# Patient Record
Sex: Female | Born: 1975 | Race: Black or African American | Hispanic: No | Marital: Single | Smoking: Current every day smoker
Health system: Southern US, Community
[De-identification: ages and names within clinical notes are randomized; demographics above are authoritative.]

## PROBLEM LIST (undated history)

## (undated) DIAGNOSIS — R87619 Unspecified abnormal cytological findings in specimens from cervix uteri: Secondary | ICD-10-CM

## (undated) DIAGNOSIS — D649 Anemia, unspecified: Secondary | ICD-10-CM

## (undated) HISTORY — DX: Unspecified abnormal cytological findings in specimens from cervix uteri: R87.619

## (undated) HISTORY — PX: MYOMECTOMY: SHX85

## (undated) HISTORY — DX: Anemia, unspecified: D64.9

---

## 2000-01-29 ENCOUNTER — Emergency Department (HOSPITAL_COMMUNITY): Admission: EM | Admit: 2000-01-29 | Discharge: 2000-01-29 | Payer: Self-pay

## 2001-08-25 ENCOUNTER — Other Ambulatory Visit: Admission: RE | Admit: 2001-08-25 | Discharge: 2001-08-25 | Payer: Self-pay | Admitting: Obstetrics and Gynecology

## 2002-06-16 ENCOUNTER — Other Ambulatory Visit: Admission: RE | Admit: 2002-06-16 | Discharge: 2002-06-16 | Payer: Self-pay | Admitting: Obstetrics and Gynecology

## 2002-09-16 ENCOUNTER — Other Ambulatory Visit: Admission: RE | Admit: 2002-09-16 | Discharge: 2002-09-16 | Payer: Self-pay | Admitting: Obstetrics and Gynecology

## 2003-12-29 ENCOUNTER — Other Ambulatory Visit: Admission: RE | Admit: 2003-12-29 | Discharge: 2003-12-29 | Payer: Self-pay | Admitting: Obstetrics and Gynecology

## 2014-08-31 ENCOUNTER — Encounter: Payer: Self-pay | Admitting: Certified Nurse Midwife

## 2014-09-05 ENCOUNTER — Ambulatory Visit (INDEPENDENT_AMBULATORY_CARE_PROVIDER_SITE_OTHER): Payer: BC Managed Care – PPO | Admitting: Certified Nurse Midwife

## 2014-09-05 ENCOUNTER — Encounter: Payer: Self-pay | Admitting: Certified Nurse Midwife

## 2014-09-05 VITALS — BP 100/64 | HR 70 | Resp 16 | Ht 62.75 in | Wt 171.0 lb

## 2014-09-05 DIAGNOSIS — D649 Anemia, unspecified: Secondary | ICD-10-CM

## 2014-09-05 DIAGNOSIS — N898 Other specified noninflammatory disorders of vagina: Secondary | ICD-10-CM

## 2014-09-05 DIAGNOSIS — Z124 Encounter for screening for malignant neoplasm of cervix: Secondary | ICD-10-CM

## 2014-09-05 DIAGNOSIS — N852 Hypertrophy of uterus: Secondary | ICD-10-CM

## 2014-09-05 DIAGNOSIS — Z01419 Encounter for gynecological examination (general) (routine) without abnormal findings: Secondary | ICD-10-CM

## 2014-09-05 DIAGNOSIS — Z0000 Encounter for general adult medical examination without abnormal findings: Secondary | ICD-10-CM

## 2014-09-05 DIAGNOSIS — D509 Iron deficiency anemia, unspecified: Secondary | ICD-10-CM

## 2014-09-05 LAB — IBC PANEL
%SAT: 3 % — ABNORMAL LOW (ref 20–55)
TIBC: 388 ug/dL (ref 250–470)
UIBC: 378 ug/dL (ref 125–400)

## 2014-09-05 LAB — CBC
HCT: 33.9 % — ABNORMAL LOW (ref 36.0–46.0)
Hemoglobin: 10.3 g/dL — ABNORMAL LOW (ref 12.0–15.0)
MCH: 24.6 pg — AB (ref 26.0–34.0)
MCHC: 30.4 g/dL (ref 30.0–36.0)
MCV: 80.9 fL (ref 78.0–100.0)
PLATELETS: 340 10*3/uL (ref 150–400)
RBC: 4.19 MIL/uL (ref 3.87–5.11)
RDW: 16 % — ABNORMAL HIGH (ref 11.5–15.5)
WBC: 5.5 10*3/uL (ref 4.0–10.5)

## 2014-09-05 LAB — POCT URINALYSIS DIPSTICK
BILIRUBIN UA: NEGATIVE
Blood, UA: NEGATIVE
Glucose, UA: NEGATIVE
Ketones, UA: NEGATIVE
LEUKOCYTES UA: NEGATIVE
NITRITE UA: NEGATIVE
PH UA: 5
Protein, UA: NEGATIVE
Urobilinogen, UA: NEGATIVE

## 2014-09-05 LAB — LIPID PANEL
CHOLESTEROL: 141 mg/dL (ref 0–200)
HDL: 68 mg/dL (ref >39)
LDL Cholesterol: 65 mg/dL (ref 0–99)
TRIGLYCERIDES: 42 mg/dL (ref <150)
Total CHOL/HDL Ratio: 2.1 Ratio
VLDL: 8 mg/dL (ref 0–40)

## 2014-09-05 LAB — FERRITIN: Ferritin: 1 ng/mL — ABNORMAL LOW (ref 10–291)

## 2014-09-05 LAB — TSH: TSH: 2.1 u[IU]/mL (ref 0.350–4.500)

## 2014-09-05 LAB — IRON: Iron: 10 ug/dL — ABNORMAL LOW (ref 42–145)

## 2014-09-05 NOTE — Patient Instructions (Signed)
Fibroids Fibroids are lumps (tumors) that can occur any place in a woman's body. These lumps are not cancerous. Fibroids vary in size, weight, and where they grow. HOME CARE  Do not take aspirin.  Write down the number of pads or tampons you use during your period. Tell your doctor. This can help determine the best treatment for you. GET HELP RIGHT AWAY IF:  You have pain in your lower belly (abdomen) that is not helped with medicine.  You have cramps that are not helped with medicine.  You have more bleeding between or during your period.  You feel lightheaded or pass out (faint).  Your lower belly pain gets worse. MAKE SURE YOU:  Understand these instructions.  Will watch your condition.  Will get help right away if you are not doing well or get worse. Document Released: 01/04/2011 Document Revised: 02/24/2012 Document Reviewed: 01/04/2011 [facility] Patient Information 2015 [place], [place]. This information is not intended to replace advice given to you by your health care provider. Make sure you discuss any questions you have with your health care provider. Iron-Rich Diet An iron-rich diet contains foods that are good sources of iron. Iron is an important mineral that helps your body produce hemoglobin. Hemoglobin is a protein in red blood cells that carries oxygen to the body's tissues. Sometimes, the iron level in your blood can be low. This may be caused by:  A lack of iron in your diet.  Blood loss.  Times of growth, such as during pregnancy or during a child's growth and development. Low levels of iron can cause a decrease in the number of red blood cells. This can result in iron deficiency anemia. Iron deficiency anemia symptoms include:  Tiredness.  Weakness.  Irritability.  Increased chance of infection. Here are some recommendations for daily iron intake:  Males older than 36 years of age need 8 mg of iron per day.  Women ages 35 to 58 need 18 mg of iron per  day.  Pregnant women need 27 mg of iron per day, and women who are over 95 years of age and breastfeeding need 9 mg of iron per day.  Women over the age of 55 need 8 mg of iron per day. SOURCES OF IRON There are 2 types of iron that are found in food: heme iron and nonheme iron. Heme iron is absorbed by the body better than nonheme iron. Heme iron is found in meat, poultry, and fish. Nonheme iron is found in grains, beans, and vegetables. Heme Iron Sources Food / Iron (mg)  Chicken liver, 3 oz (85 g)/ 10 mg  Beef liver, 3 oz (85 g)/ 5.5 mg  Oysters, 3 oz (85 g)/ 8 mg  Beef, 3 oz (85 g)/ 2 to 3 mg  Shrimp, 3 oz (85 g)/ 2.8 mg  [place], 3 oz (85 g)/ 2 mg  Chicken, 3 oz (85 g) / 1 mg  Fish (tuna, halibut), 3 oz (85 g)/ 1 mg  Pork, 3 oz (85 g)/ 0.9 mg Nonheme Iron Sources Food / Iron (mg)  Ready-to-eat breakfast cereal, iron-fortified / 3.9 to 7 mg  Tofu,  cup / 3.4 mg  Kidney beans,  cup / 2.6 mg  Baked potato with skin / 2.7 mg  Asparagus,  cup / 2.2 mg  Avocado / 2 mg  Dried peaches,  cup / 1.6 mg  Raisins,  cup / 1.5 mg  Soy milk, 1 cup / 1.5 mg  Whole-wheat bread, 1 slice / 1.2  mg  Spinach, 1 cup / 0.8 mg  Broccoli,  cup / 0.6 mg IRON ABSORPTION Certain foods can decrease the body's absorption of iron. Try to avoid these foods and beverages while eating meals with iron-containing foods:  Coffee.  Tea.  Fiber.  Soy. Foods containing vitamin C can help increase the amount of iron your body absorbs from iron sources, especially from nonheme sources. Eat foods with vitamin C along with iron-containing foods to increase your iron absorption. Foods that are high in vitamin C include many fruits and vegetables. Some good sources are:  Fresh orange juice.  Oranges.  Strawberries.  Mangoes.  Grapefruit.  Red bell peppers.  Green bell peppers.  Broccoli.  Potatoes with skin.  Tomato juice. Document Released: 07/16/2005 Document Revised:  02/24/2012 Document Reviewed: 05/23/2011 [facility] Patient Information 2015 [place], [place]. This information is not intended to replace advice given to you by your health care provider. Make sure you discuss any questions you have with your health care provider.   EXERCISE AND DIET:  We recommended that you start or continue a regular exercise program for good health. Regular exercise means any activity that makes your heart beat faster and makes you sweat.  We recommend exercising at least 30 minutes per day at least 3 days a week, preferably 4 or 5.  We also recommend a diet low in fat and sugar.  Inactivity, poor dietary choices and obesity can cause diabetes, heart attack, stroke, and kidney damage, among others.    ALCOHOL AND SMOKING:  Women should limit their alcohol intake to no more than 7 drinks/beers/glasses of wine (combined, not each!) per week. Moderation of alcohol intake to this level decreases your risk of breast cancer and liver damage. And of course, no recreational drugs are part of a healthy lifestyle.  And absolutely no smoking or even second hand smoke. Most people know smoking can cause heart and lung diseases, but did you know it also contributes to weakening of your bones? Aging of your skin?  Yellowing of your teeth and nails?  CALCIUM AND VITAMIN D:  Adequate intake of calcium and Vitamin D are recommended.  The recommendations for exact amounts of these supplements seem to change often, but generally speaking 600 mg of calcium (either carbonate or citrate) and 800 units of Vitamin D per day seems prudent. Certain women may benefit from higher intake of Vitamin D.  If you are among these women, your doctor will have told you during your visit.    PAP SMEARS:  Pap smears, to check for cervical cancer or precancers,  have traditionally been done yearly, although recent scientific advances have shown that most women can have pap smears less often.  However, every woman still should  have a physical exam from her gynecologist every year. It will include a breast check, inspection of the vulva and vagina to check for abnormal growths or skin changes, a visual exam of the cervix, and then an exam to evaluate the size and shape of the uterus and ovaries.  And after 36 years of age, a rectal exam is indicated to check for rectal cancers. We will also provide age appropriate advice regarding health maintenance, like when you should have certain vaccines, screening for sexually transmitted diseases, bone density testing, colonoscopy, mammograms, etc.   MAMMOGRAMS:  All women over 26 years old should have a yearly mammogram. Many facilities now offer a "3D" mammogram, which may cost around $50 extra out of pocket. If possible,  we recommend you accept the option to have the 3D mammogram performed.  It both reduces the number of women who will be called back for extra views which then turn out to be normal, and it is better than the routine mammogram at detecting truly abnormal areas.    COLONOSCOPY:  Colonoscopy to screen for colon cancer is recommended for all women at age 74.  We know, you hate the idea of the prep.  We agree, BUT, having colon cancer and not knowing it is worse!!  Colon cancer so often starts as a polyp that can be seen and removed at colonscopy, which can quite literally save your life!  And if your first colonoscopy is normal and you have no family history of colon cancer, most women don't have to have it again for 10 years.  Once every ten years, you can do something that may end up saving your life, right?  We will be happy to help you get it scheduled when you are ready.  Be sure to check your insurance coverage so you understand how much it will cost.  It may be covered as a preventative service at no cost, but you should check your particular policy.

## 2014-09-05 NOTE — Progress Notes (Signed)
38 y.o. G0P0000 Single African American Fe here for annual exam. Sexually active in past but no sexual activity in past year. Desires STD screening today, no concerns. Periods normal every 28 -36 days with minimal cramping. Patient complaining of spotting in 6/15 and this month 9/17 2-3 days after period. Contraception not needed at present, when active uses condoms. Denies vaginal itching or burning or change in discharge.  Sees Urgent care if needed. Last TDAP in college greater than 10 years ago. No history of anemia.  Patient's last menstrual period was 08/25/2014.          Sexually active: Yes.    The current method of family planning is none.    Exercising: Yes.    cardio & weights Smoker:  yes  Health Maintenance: Pap:  21yrs ago? MMG:  none Colonoscopy:  none BMD:   none TDaP:  Unsure per patient Labs: Hgb-10.3, Poct urine-neg Self breast exam: done occ   reports that she has been smoking.  She has never used smokeless tobacco. She reports that she drinks about 1.5 - 2 ounces of alcohol per week. She reports that she does not use illicit drugs.  Past Medical History  Diagnosis Date  . Abnormal Pap smear of cervix     yrs ago, pt believes she had a procedure done for this    History reviewed. No pertinent past surgical history.  No current outpatient prescriptions on file.   No current facility-administered medications for this visit.    Family History  Problem Relation Age of Onset  . Diabetes Mother   . Hypertension Mother   . Cancer Maternal Grandfather     prostate  . Cancer Paternal Grandmother     lung    ROS:  Pertinent items are noted in HPI.  Otherwise, a comprehensive ROS was negative.  Exam:   BP 100/64  Pulse 70  Resp 16  Ht 5' 2.75" (1.594 m)  Wt 171 lb (77.565 kg)  BMI 30.53 kg/m2  LMP 08/25/2014 Height: 5' 2.75" (159.4 cm)  Ht Readings from Last 3 Encounters:  09/05/14 5' 2.75" (1.594 m)    General appearance: alert, cooperative and appears  stated age Head: Normocephalic, without obvious abnormality, atraumatic Neck: no adenopathy, supple, symmetrical, trachea midline and thyroid normal to inspection and palpation Lungs: clear to auscultation bilaterally Breasts: normal appearance, no masses or tenderness, No nipple retraction or dimpling, No nipple discharge or bleeding, No axillary or supraclavicular adenopathy Heart: regular rate and rhythm Abdomen: soft, non-tender; no masses,  no organomegaly Extremities: extremities normal, atraumatic, no cyanosis or edema Skin: Skin color, texture, turgor normal. No rashes or lesions Lymph nodes: Cervical, supraclavicular, and axillary nodes normal. No abnormal inguinal nodes palpated Neurologic: Grossly normal   Pelvic: External genitalia:  no lesions              Urethra:  normal appearing urethra with no masses, tenderness or lesions              Bartholin's and Skene's: normal                 Vagina: normal appearing vagina with normal color and discharge, no lesions ph. 4.0 wet prep taken              Cervix: normal, non tender, no masses              Pap taken: Yes.   Bimanual Exam:  Uterus:  enlarged, 8-10 weeks size, anteflexed and firm, ?  Fibroid, deviates to left              Adnexa: normal adnexa and no mass, fullness, tenderness, unable to palpate Left adnexal               Rectovaginal: Confirms               Anus:  normal sphincter tone, no lesions   Wet prep negative for pathogens  A:  Well Woman with normal exam  Contraception, none needed at present, uses condoms  Enlarged uterus ? Fibroid vs Left adnexal mass  STD screening  R/O anemia  Normal Leukorrhea  Immunization update  P:   Reviewed health and wellness pertinent to exam  Discussed finding of enlarged uterus and may be related to spotting issues. Discussed ? Fibroid, which is benign tumor, adenomyosis or mass. Discussed need for PUS to evaluate. Questions addressed Patient agreeable or PUS.Instructed  patient will be called with insurance information and scheduled. Continue menses calendar with spotting   Lab GC,Chlamydia STD panel  Iron, Ferritin, IBC,CBC  Discussed ? Anemia per Hgb. Encouraged increase of iron foods in diet and increase vitamin C for absorption. Handout given  Patient to update TDAP at PUS  Pap smear taken today with HPVHR   counseled on breast self exam, STD prevention, HIV risk factors and prevention, adequate intake of calcium and vitamin D, diet and exercise  return annually or prn, as above  An After Visit Summary was printed and given to the patient.

## 2014-09-06 LAB — STD PANEL
HEP B S AG: NEGATIVE
HIV: NONREACTIVE

## 2014-09-06 LAB — HEMOGLOBIN, FINGERSTICK: Hemoglobin, fingerstick: 10.3 g/dL — ABNORMAL LOW (ref 12.0–16.0)

## 2014-09-06 MED ORDER — FUSION PLUS PO CAPS
1.0000 | ORAL_CAPSULE | Freq: Every day | ORAL | Status: DC
Start: 2014-09-06 — End: 2015-09-20

## 2014-09-07 LAB — IPS N GONORRHOEA AND CHLAMYDIA BY PCR

## 2014-09-07 LAB — IPS PAP TEST WITH HPV

## 2014-09-07 NOTE — Progress Notes (Signed)
Agree with proceeding with PUS first.  Thanks.  Reviewed personally.  Felipa Emory, MD.

## 2014-09-08 ENCOUNTER — Telehealth: Payer: Self-pay | Admitting: Certified Nurse Midwife

## 2014-09-08 NOTE — Telephone Encounter (Signed)
Left message for patient to call back. Need to go over benefits and schedule PUS °

## 2014-09-09 NOTE — Telephone Encounter (Signed)
Pt is returning a call to [place].

## 2014-09-12 NOTE — Telephone Encounter (Signed)
Spoke with patient. Advised that per benefit quote received, she will be responsible for a $40 copay when she comes in for PUS. Patient agreeable.  Scheduled PUS. Advised patient of 72 hour cancellation policy and $803 cancellation fee. Patient agreeable.

## 2014-09-13 NOTE — Addendum Note (Signed)
Addended by: Michele Mcalpine on: 09/13/2014 10:47 AM   Modules accepted: Orders

## 2014-09-22 ENCOUNTER — Ambulatory Visit (INDEPENDENT_AMBULATORY_CARE_PROVIDER_SITE_OTHER): Payer: BC Managed Care – PPO | Admitting: Obstetrics and Gynecology

## 2014-09-22 ENCOUNTER — Encounter: Payer: Self-pay | Admitting: Obstetrics and Gynecology

## 2014-09-22 ENCOUNTER — Ambulatory Visit (INDEPENDENT_AMBULATORY_CARE_PROVIDER_SITE_OTHER): Payer: BC Managed Care – PPO

## 2014-09-22 VITALS — BP 118/78 | HR 76 | Ht 62.75 in | Wt 173.0 lb

## 2014-09-22 DIAGNOSIS — N852 Hypertrophy of uterus: Secondary | ICD-10-CM

## 2014-09-22 DIAGNOSIS — D219 Benign neoplasm of connective and other soft tissue, unspecified: Secondary | ICD-10-CM | POA: Insufficient documentation

## 2014-09-22 DIAGNOSIS — D259 Leiomyoma of uterus, unspecified: Secondary | ICD-10-CM

## 2014-09-22 MED ORDER — NORETHINDRONE 0.35 MG PO TABS: 1.0000 | ORAL_TABLET | Freq: Every day | ORAL | Status: DC

## 2014-09-22 NOTE — Progress Notes (Signed)
GYNECOLOGY  VISIT   HPI: 38 y.o.   Single  African American  female   Angela Hanna with Patient's last menstrual period was 09/18/2014.   here for consultation regarding fibroids.    Has iron deficiency anemia.  Taking iron and is feeling better.  Heavy cycles starting mid 2014.  Monthly menses. Last almost one week.  Using two pads at the same time.  Can have bleeding 5 days after cycle is complete.  Lasts for a couple of days.  Does not occur regularly.   Is a smoker.   Would like future childbearing.  Not currently sexually active.   GYNECOLOGIC HISTORY: Patient's last menstrual period was 09/18/2014. Contraception:             OB History   Grav Para Term Preterm Abortions TAB SAB Ect Mult Living   0 0 0 0 0 0 0 0 0 0          Patient Active Problem List   Diagnosis Date Noted  . Enlarged uterus 09/05/2014    Class: Diagnosis of    Past Medical History  Diagnosis Date  . Abnormal Pap smear of cervix     yrs ago, pt believes she had a procedure done for this    History reviewed. No pertinent past surgical history.  Current Outpatient Prescriptions  Medication Sig Dispense Refill  . Iron-FA-B Cmp-C-Biot-Probiotic (FUSION PLUS) CAPS Take 1 capsule by mouth at bedtime.  30 capsule  4   No current facility-administered medications for this visit.     ALLERGIES: Review of patient's allergies indicates no known allergies.  Family History  Problem Relation Age of Onset  . Diabetes Mother   . Hypertension Mother   . Cancer Maternal Grandfather     prostate  . Cancer Paternal Grandmother     lung    History   Social History  . Marital Status: Single    Spouse Name: N/A    Number of Children: N/A  . Years of Education: N/A   Occupational History  . Not on file.   Social History Main Topics  . Smoking status: Current Every Day Smoker  . Smokeless tobacco: Never Used     Comment: 2 packs per week  . Alcohol Use: 1.5 - 2.0 oz/week    3-4 drink(s) per week   . Drug Use: No  . Sexual Activity: Not Currently    Partners: Male    Birth Control/ Protection: Abstinence   Other Topics Concern  . Not on file   Social History Narrative  . No narrative on file    ROS:  Pertinent items are noted in HPI.  PHYSICAL EXAMINATION:    BP 118/78  Pulse 76  Ht 5' 2.75" (1.594 m)  Wt 173 lb (78.472 kg)  BMI 30.88 kg/m2  LMP 09/18/2014     General appearance: alert, cooperative and appears stated age Abdomen: soft, non-tender; no masses,  no organomegaly                                     ASSESSMENT  Multifibroid uterus with anemia.  Smoker over age 73.   PLAN  ACOG materials on fibroids + discussion about fibroids.  Discussed options for care including progesterone only OCPs, Depo Provera, uterine artery embolization, myomectomy and hysterectomy.  Will proceed with OrthoMicronor for 3 months. Side effects and proper use discussed. Follow up in 3 months for  recheck of pills.  Discussed possible EMB if cycles are not well controlled of pills.  Recheck of anemia in one month.    An After Visit Summary was printed and given to the patient.  _25_____ minutes face to face time of which over 50% was spent in counseling.     Ultrasound images and report reviewed with patient.  Multifibroid uterus with distorted endometrium (cannot rule out mass) and small fluid in endometrial canal.  EMS 6.53. Normal ovaries.  No free fluid.

## 2014-09-22 NOTE — Patient Instructions (Signed)
Norethindrone tablets (contraception) What is this medicine? NORETHINDRONE (nor eth IN drone) is an oral contraceptive. The product contains a female hormone known as a progestin. It is used to prevent pregnancy. This medicine may be used for other purposes; ask your health care provider or pharmacist if you have questions. COMMON BRAND NAME(S): Camila, Deblitane 28-Day, [place]in, Heather, [place], [place], [place], Nor-QD, Nora-BE, Norlyroc, Ortho Micronor, [employer] 28-Day What should I tell my health care provider before I take this medicine? They need to know if you have any of these conditions: -blood vessel disease or blood clots -breast, cervical, or vaginal cancer -diabetes -heart disease -kidney disease -liver disease -mental depression -migraine -seizures -stroke -vaginal bleeding -an unusual or allergic reaction to norethindrone, other medicines, foods, dyes, or preservatives -pregnant or trying to get pregnant -breast-feeding How should I use this medicine? Take this medicine by mouth with a glass of water. You may take it with or without food. Follow the directions on the prescription label. Take this medicine at the same time each day and in the order directed on the package. Do not take your medicine more often than directed. Contact your pediatrician regarding the use of this medicine in children. Special care may be needed. This medicine has been used in female children who have started having menstrual periods. A patient package insert for the product will be given with each prescription and refill. Read this sheet carefully each time. The sheet may change frequently. Overdosage: If you think you have taken too much of this medicine contact a poison control center or emergency room at once. NOTE: This medicine is only for you. Do not share this medicine with others. What if I miss a dose? Try not to miss a dose. Every time you miss a dose or take a dose late your chance of  pregnancy increases. When 1 pill is missed (even if only 3 hours late), take the missed pill as soon as possible and continue taking a pill each day at the regular time (use a back up method of birth control for the next 48 hours). If more than 1 dose is missed, use an additional birth control method for the rest of your pill pack until menses occurs. Contact your health care professional if more than 1 dose has been missed. What may interact with this medicine? Do not take this medicine with any of the following medications: -amprenavir or fosamprenavir -bosentan This medicine may also interact with the following medications: -antibiotics or medicines for infections, especially rifampin, rifabutin, rifapentine, and griseofulvin, and possibly penicillins or tetracyclines -aprepitant -barbiturate medicines, such as phenobarbital -carbamazepine -felbamate -modafinil -oxcarbazepine -phenytoin -ritonavir or other medicines for HIV infection or AIDS -St. John's wort -topiramate This list may not describe all possible interactions. Give your health care provider a list of all the medicines, herbs, non-prescription drugs, or dietary supplements you use. Also tell them if you smoke, drink alcohol, or use illegal drugs. Some items may interact with your medicine. What should I watch for while using this medicine? Visit your doctor or health care professional for regular checks on your progress. You will need a regular breast and pelvic exam and Pap smear while on this medicine. Use an additional method of birth control during the first cycle that you take these tablets. If you have any reason to think you are pregnant, stop taking this medicine right away and contact your doctor or health care professional. If you are taking this medicine for hormone related problems, it  may take several cycles of use to see improvement in your condition. This medicine does not protect you against HIV infection (AIDS)  or any other sexually transmitted diseases. What side effects may I notice from receiving this medicine? Side effects that you should report to your doctor or health care professional as soon as possible: -breast tenderness or discharge -pain in the abdomen, chest, groin or leg -severe headache -skin rash, itching, or hives -sudden shortness of breath -unusually weak or tired -vision or speech problems -yellowing of skin or eyes Side effects that usually do not require medical attention (report to your doctor or health care professional if they continue or are bothersome): -changes in sexual desire -change in menstrual flow -facial hair growth -fluid retention and swelling -headache -irritability -nausea -weight gain or loss This list may not describe all possible side effects. Call your doctor for medical advice about side effects. You may report side effects to FDA at 1-800-FDA-1088. Where should I keep my medicine? Keep out of the reach of children. Store at room temperature between 15 and 30 degrees C (59 and 86 degrees F). Throw away any unused medicine after the expiration date. NOTE: This sheet is a summary. It may not cover all possible information. If you have questions about this medicine, talk to your doctor, pharmacist, or health care provider.  2015, [employer] Standard. (2012-08-21 16:41:35)  

## 2014-10-06 ENCOUNTER — Encounter: Payer: Self-pay | Admitting: Certified Nurse Midwife

## 2014-10-07 NOTE — Telephone Encounter (Signed)
PUS with Dr Quincy Simmonds 09-22-14.  Started  On Micronor.

## 2014-10-17 ENCOUNTER — Other Ambulatory Visit: Payer: BC Managed Care – PPO

## 2014-10-21 ENCOUNTER — Other Ambulatory Visit: Payer: BC Managed Care – PPO

## 2014-11-04 ENCOUNTER — Other Ambulatory Visit: Payer: BC Managed Care – PPO

## 2014-12-30 ENCOUNTER — Encounter: Payer: Self-pay | Admitting: Obstetrics and Gynecology

## 2014-12-30 ENCOUNTER — Ambulatory Visit (INDEPENDENT_AMBULATORY_CARE_PROVIDER_SITE_OTHER): Payer: BLUE CROSS/BLUE SHIELD | Admitting: Obstetrics and Gynecology

## 2014-12-30 VITALS — BP 126/68 | HR 88 | Resp 16 | Ht 62.75 in | Wt 171.0 lb

## 2014-12-30 DIAGNOSIS — D259 Leiomyoma of uterus, unspecified: Secondary | ICD-10-CM

## 2014-12-30 DIAGNOSIS — D509 Iron deficiency anemia, unspecified: Secondary | ICD-10-CM

## 2014-12-30 LAB — CBC
HEMATOCRIT: 40.2 % (ref 36.0–46.0)
Hemoglobin: 13.1 g/dL (ref 12.0–15.0)
MCH: 31.3 pg (ref 26.0–34.0)
MCHC: 32.6 g/dL (ref 30.0–36.0)
MCV: 95.9 fL (ref 78.0–100.0)
MPV: 10 fL (ref 8.6–12.4)
Platelets: 218 10*3/uL (ref 150–400)
RBC: 4.19 MIL/uL (ref 3.87–5.11)
RDW: 13.3 % (ref 11.5–15.5)
WBC: 5.1 10*3/uL (ref 4.0–10.5)

## 2014-12-30 LAB — IBC PANEL
%SAT: 36 % (ref 20–55)
TIBC: 286 ug/dL (ref 250–470)
UIBC: 184 ug/dL (ref 125–400)

## 2014-12-30 LAB — FERRITIN: Ferritin: 26 ng/mL (ref 10–291)

## 2014-12-30 LAB — IRON: Iron: 102 ug/dL (ref 42–145)

## 2014-12-30 MED ORDER — NORETHINDRONE 0.35 MG PO TABS: 1.0000 | ORAL_TABLET | Freq: Every day | ORAL | Status: DC

## 2014-12-30 NOTE — Progress Notes (Signed)
GYNECOLOGY  VISIT   HPI: 39 y.o.   Single  African American  female   [place] with Patient's last menstrual period was 12/26/2014.   here for  Follow up on Micronor. For first month, menses lasted 3.5 - 4 weeks.  Menses is every 25 - 26 days.  Bleeding has decreased some.  Last 5 - 6 days.  2 heavy days.  No intermenstrual bleeding.  Ultrasound - October 2015.  Multifibroid uterus with distorted endometrium (cannot rule out mass) and small fluid in endometrial canal.  EMS 6.53. Normal ovaries.  No free fluid.   No EMB done pending trial of Micronor.   Patient is a smoker.   Patient is taking iron and feeling better.   Is considering fertility for the future but not immediately.  GYNECOLOGIC HISTORY: Patient's last menstrual period was 12/26/2014. Contraception: Micronor   Menopausal hormone therapy: N/A        OB History    Gravida Para Term Preterm AB TAB SAB Ectopic Multiple Living   0 0 0 0 0 0 0 0 0 0          Patient Active Problem List   Diagnosis Date Noted  . Fibroids 09/22/2014    Past Medical History  Diagnosis Date  . Abnormal Pap smear of cervix     yrs ago, pt believes she had a procedure done for this    History reviewed. No pertinent past surgical history.  Current Outpatient Prescriptions  Medication Sig Dispense Refill  . Iron-FA-B Cmp-C-Biot-Probiotic (FUSION PLUS) CAPS Take 1 capsule by mouth at bedtime. 30 capsule 4  . norethindrone (MICRONOR,CAMILA,ERRIN) 0.35 MG tablet Take 1 tablet (0.35 mg total) by mouth daily. 1 Package 3   No current facility-administered medications for this visit.     ALLERGIES: Review of patient's allergies indicates no known allergies.  Family History  Problem Relation Age of Onset  . Diabetes Mother   . Hypertension Mother   . Cancer Maternal Grandfather     prostate  . Cancer Paternal Grandmother     lung    History   Social History  . Marital Status: Single    Spouse Name: N/A    Number of  Children: N/A  . Years of Education: N/A   Occupational History  . Not on file.   Social History Main Topics  . Smoking status: Current Every Day Smoker  . Smokeless tobacco: Never Used     Comment: 2 packs per week  . Alcohol Use: 1.5 - 2.0 oz/week    3-4 Not specified per week  . Drug Use: No  . Sexual Activity:    Partners: Male    Birth Control/ Protection: Pill   Other Topics Concern  . Not on file   Social History Narrative    ROS:  Pertinent items are noted in HPI.  PHYSICAL EXAMINATION:    BP 126/68 mmHg  Pulse 88  Resp 16  Ht 5' 2.75" (1.594 m)  Wt 171 lb (77.565 kg)  BMI 30.53 kg/m2  LMP 12/26/2014     General appearance: alert, cooperative and appears stated age   ASSESSMENT  Uterine fibroids.  Symptoms of menometrorrhagia improved on Micronor.  No intermenstrual bleeding.  AMA status.  Desire for future fertility but not immediately.  PLAN  Continue with Micronor.  Discussed myomectomy with patient - laparoscopy versus laparotomy, potential risks of surgery, recovery time between myomectomy and future pregnancy, and need for possible Cesarean section for deliveries.  CBC, iron  studies, ferritin. I do not recommend endometrial biopsy at this time. Return prn and for usual annual exam.  An After Visit Summary was printed and given to the patient.  _15_____ minutes face to face time of which over 50% was spent in counseling.

## 2015-01-01 ENCOUNTER — Encounter: Payer: Self-pay | Admitting: Obstetrics and Gynecology

## 2015-03-29 ENCOUNTER — Telehealth: Payer: Self-pay | Admitting: Certified Nurse Midwife

## 2015-03-29 NOTE — Telephone Encounter (Signed)
Left message for pt to call and reschedule aex appointment.

## 2015-04-21 ENCOUNTER — Other Ambulatory Visit: Payer: Self-pay | Admitting: Obstetrics & Gynecology

## 2015-04-21 DIAGNOSIS — Z006 Encounter for examination for normal comparison and control in clinical research program: Secondary | ICD-10-CM

## 2015-05-05 ENCOUNTER — Other Ambulatory Visit: Payer: Self-pay

## 2015-09-11 ENCOUNTER — Ambulatory Visit: Payer: BC Managed Care – PPO | Admitting: Certified Nurse Midwife

## 2015-09-12 ENCOUNTER — Ambulatory Visit: Payer: BLUE CROSS/BLUE SHIELD | Admitting: Certified Nurse Midwife

## 2015-09-20 ENCOUNTER — Encounter: Payer: Self-pay | Admitting: Certified Nurse Midwife

## 2015-09-20 ENCOUNTER — Ambulatory Visit (INDEPENDENT_AMBULATORY_CARE_PROVIDER_SITE_OTHER): Payer: BLUE CROSS/BLUE SHIELD | Admitting: Certified Nurse Midwife

## 2015-09-20 VITALS — BP 118/78 | HR 72 | Resp 16 | Ht 62.75 in | Wt 169.0 lb

## 2015-09-20 DIAGNOSIS — Z23 Encounter for immunization: Secondary | ICD-10-CM

## 2015-09-20 DIAGNOSIS — Z0000 Encounter for general adult medical examination without abnormal findings: Secondary | ICD-10-CM | POA: Diagnosis not present

## 2015-09-20 DIAGNOSIS — D259 Leiomyoma of uterus, unspecified: Secondary | ICD-10-CM

## 2015-09-20 DIAGNOSIS — Z3041 Encounter for surveillance of contraceptive pills: Secondary | ICD-10-CM

## 2015-09-20 DIAGNOSIS — N852 Hypertrophy of uterus: Secondary | ICD-10-CM

## 2015-09-20 DIAGNOSIS — D509 Iron deficiency anemia, unspecified: Secondary | ICD-10-CM

## 2015-09-20 DIAGNOSIS — N898 Other specified noninflammatory disorders of vagina: Secondary | ICD-10-CM

## 2015-09-20 DIAGNOSIS — Z01419 Encounter for gynecological examination (general) (routine) without abnormal findings: Secondary | ICD-10-CM | POA: Diagnosis not present

## 2015-09-20 LAB — CBC
HCT: 34.3 % — ABNORMAL LOW (ref 36.0–46.0)
HEMOGLOBIN: 10.4 g/dL — AB (ref 12.0–15.0)
MCH: 25.5 pg — AB (ref 26.0–34.0)
MCHC: 30.3 g/dL (ref 30.0–36.0)
MCV: 84.1 fL (ref 78.0–100.0)
MPV: 9.7 fL (ref 8.6–12.4)
Platelets: 313 10*3/uL (ref 150–400)
RBC: 4.08 MIL/uL (ref 3.87–5.11)
RDW: 14.9 % (ref 11.5–15.5)
WBC: 5.9 10*3/uL (ref 4.0–10.5)

## 2015-09-20 LAB — IBC PANEL
%SAT: 1 % — AB (ref 11–50)
TIBC: 434 ug/dL (ref 250–450)
UIBC: 429 ug/dL — AB (ref 125–400)

## 2015-09-20 LAB — POCT URINALYSIS DIPSTICK
Bilirubin, UA: NEGATIVE
Blood, UA: NEGATIVE
Glucose, UA: NEGATIVE
Ketones, UA: NEGATIVE
Leukocytes, UA: NEGATIVE
Nitrite, UA: NEGATIVE
PH UA: 5
Protein, UA: NEGATIVE
Urobilinogen, UA: NEGATIVE

## 2015-09-20 LAB — IRON: Iron: <10 ug/dL — ABNORMAL LOW (ref 40–190)

## 2015-09-20 LAB — HEMOGLOBIN, FINGERSTICK: Hemoglobin, fingerstick: 10.3 g/dL — ABNORMAL LOW (ref 12.0–16.0)

## 2015-09-20 LAB — FERRITIN: Ferritin: 6 ng/mL — ABNORMAL LOW (ref 10–291)

## 2015-09-20 MED ORDER — NORETHINDRONE 0.35 MG PO TABS: 1.0000 | ORAL_TABLET | Freq: Every day | ORAL | Status: DC

## 2015-09-20 MED ORDER — FUSION PLUS PO CAPS: 1.0000 | ORAL_CAPSULE | Freq: Every day | ORAL | Status: DC

## 2015-09-20 NOTE — Progress Notes (Signed)
Reviewed personally.  M. Suzanne Shameika Speelman, MD.  

## 2015-09-20 NOTE — Patient Instructions (Signed)
EXERCISE AND DIET:  We recommended that you start or continue a regular exercise program for good health. Regular exercise means any activity that makes your heart beat faster and makes you sweat.  We recommend exercising at least 30 minutes per day at least 3 days a week, preferably 4 or 5.  We also recommend a diet low in fat and sugar.  Inactivity, poor dietary choices and obesity can cause diabetes, heart attack, stroke, and kidney damage, among others.    ALCOHOL AND SMOKING:  Women should limit their alcohol intake to no more than 7 drinks/beers/glasses of wine (combined, not each!) per week. Moderation of alcohol intake to this level decreases your risk of breast cancer and liver damage. And of course, no recreational drugs are part of a healthy lifestyle.  And absolutely no smoking or even second hand smoke. Most people know smoking can cause heart and lung diseases, but did you know it also contributes to weakening of your bones? Aging of your skin?  Yellowing of your teeth and nails?  CALCIUM AND VITAMIN D:  Adequate intake of calcium and Vitamin D are recommended.  The recommendations for exact amounts of these supplements seem to change often, but generally speaking 600 mg of calcium (either carbonate or citrate) and 800 units of Vitamin D per day seems prudent. Certain women may benefit from higher intake of Vitamin D.  If you are among these women, your doctor will have told you during your visit.    PAP SMEARS:  Pap smears, to check for cervical cancer or precancers,  have traditionally been done yearly, although recent scientific advances have shown that most women can have pap smears less often.  However, every woman still should have a physical exam from her gynecologist every year. It will include a breast check, inspection of the vulva and vagina to check for abnormal growths or skin changes, a visual exam of the cervix, and then an exam to evaluate the size and shape of the uterus and  ovaries.  And after 37 years of age, a rectal exam is indicated to check for rectal cancers. We will also provide age appropriate advice regarding health maintenance, like when you should have certain vaccines, screening for sexually transmitted diseases, bone density testing, colonoscopy, mammograms, etc.   MAMMOGRAMS:  All women over 40 years old should have a yearly mammogram. Many facilities now offer a "3D" mammogram, which may cost around $50 extra out of pocket. If possible,  we recommend you accept the option to have the 3D mammogram performed.  It both reduces the number of women who will be called back for extra views which then turn out to be normal, and it is better than the routine mammogram at detecting truly abnormal areas.    COLONOSCOPY:  Colonoscopy to screen for colon cancer is recommended for all women at age 50.  We know, you hate the idea of the prep.  We agree, BUT, having colon cancer and not knowing it is worse!!  Colon cancer so often starts as a polyp that can be seen and removed at colonscopy, which can quite literally save your life!  And if your first colonoscopy is normal and you have no family history of colon cancer, most women don't have to have it again for 10 years.  Once every ten years, you can do something that may end up saving your life, right?  We will be happy to help you get it scheduled when you are ready.    Be sure to check your insurance coverage so you understand how much it will cost.  It may be covered as a preventative service at no cost, but you should check your particular policy.     Iron Deficiency Anemia, Adult Anemia is a condition in which there are less red blood cells or hemoglobin in the blood than normal. Hemoglobin is the part of red blood cells that carries oxygen. Iron deficiency anemia is anemia caused by too little iron. It is the most common type of anemia. It may leave you tired and short of breath. CAUSES   Lack of iron in the  diet.  Poor absorption of iron, as seen with intestinal disorders.  Intestinal bleeding.  Heavy periods. SIGNS AND SYMPTOMS  Mild anemia may not be noticeable. Symptoms may include:  Fatigue.  Headache.  Pale skin.  Weakness.  Tiredness.  Shortness of breath.  Dizziness.  Cold hands and feet.  Fast or irregular heartbeat. DIAGNOSIS  Diagnosis requires a thorough evaluation and physical exam by your health care provider. Blood tests are generally used to confirm iron deficiency anemia. Additional tests may be done to find the underlying cause of your anemia. These may include:  Testing for blood in the stool (fecal occult blood test).  A procedure to see inside the colon and rectum (colonoscopy).  A procedure to see inside the esophagus and stomach (endoscopy). TREATMENT  Iron deficiency anemia is treated by correcting the cause of the deficiency. Treatment may involve:  Adding iron-rich foods to your diet.  Taking iron supplements. Pregnant or breastfeeding women need to take extra iron because their normal diet usually does not provide the required amount.  Taking vitamins. Vitamin C improves the absorption of iron. Your health care provider may recommend that you take your iron tablets with a glass of orange juice or vitamin C supplement.  Medicines to make heavy menstrual flow lighter.  Surgery. HOME CARE INSTRUCTIONS   Take iron as directed by your health care provider.  If you cannot tolerate taking iron supplements by mouth, talk to your health care provider about taking them through a vein (intravenously) or an injection into a muscle.  For the best iron absorption, iron supplements should be taken on an empty stomach. If you cannot tolerate them on an empty stomach, you may need to take them with food.  Do not drink milk or take antacids at the same time as your iron supplements. Milk and antacids may interfere with the absorption of iron.  Iron  supplements can cause constipation. Make sure to include fiber in your diet to prevent constipation. A stool softener may also be recommended.  Take vitamins as directed by your health care provider.  Eat a diet rich in iron. Foods high in iron include liver, lean beef, whole-grain bread, eggs, dried fruit, and dark green leafy vegetables. SEEK IMMEDIATE MEDICAL CARE IF:   You faint. If this happens, do not drive. Call your local emergency services (911 in [place] if no other help is available.  You have chest pain.  You feel nauseous or vomit.  You have severe or increased shortness of breath with activity.  You feel weak.  You have a rapid heartbeat.  You have unexplained sweating.  You become light-headed when getting up from a chair or bed. MAKE SURE YOU:   Understand these instructions.  Will watch your condition.  Will get help right away if you are not doing well or get worse.   This information  is not intended to replace advice given to you by your health care provider. Make sure you discuss any questions you have with your health care provider.   Document Released: 11/29/2000 Document Revised: 12/23/2014 Document Reviewed: 08/09/2013 Elsevier Interactive Patient Education [employer].

## 2015-09-20 NOTE — Progress Notes (Signed)
39 y.o. [id number] Single  African American Fe here for annual exam. Periods are monthly, no spotting at all in the past 9 months. Happy with choice. Aware of fibroids and effect on bleeding  changes. Not taking multivitamin or iron supplement. Some fatigue.  Working on iron foods in diet. Sees Urgent care if needed. No partner change, but  desires STD screening. Patient has noted a slight increase in watery vaginal discharge. No itching or burning or new personal products. No thong use. No other health issues today.  Patient's last menstrual period was 09/05/2015.          Sexually active: Yes.    The current method of family planning is oral progesterone-only contraceptive.    Exercising: Yes.    cardio & weights Smoker:  no  Health Maintenance: Pap: 09-05-14 neg HPV HR neg MMG:  none Colonoscopy:  none BMD:   none TDaP:  Unsure before college Labs: poct urine-neg, hgb-10.3 Self breast exam: done occ   reports that she has been smoking.  She has never used smokeless tobacco. She reports that she drinks about 1.8 - 2.4 oz of alcohol per week. She reports that she does not use illicit drugs.  Past Medical History  Diagnosis Date  . Abnormal Pap smear of cervix     yrs ago, pt believes she had a procedure done for this  . Anemia     History reviewed. No pertinent past surgical history.  Current Outpatient Prescriptions  Medication Sig Dispense Refill  . norethindrone (MICRONOR,CAMILA,ERRIN) 0.35 MG tablet Take 1 tablet (0.35 mg total) by mouth daily. 3 Package 4   No current facility-administered medications for this visit.    Family History  Problem Relation Age of Onset  . Diabetes Mother   . Hypertension Mother   . Cancer Maternal Grandfather     prostate  . Cancer Paternal Grandmother     lung    ROS:  Pertinent items are noted in HPI.  Otherwise, a comprehensive ROS was negative.  Exam:   BP 118/78 mmHg  Pulse 72  Resp 16  Ht 5' 2.75" (1.594 m)  Wt 169 lb (76.658  kg)  BMI 30.17 kg/m2  LMP 09/05/2015 Height: 5' 2.75" (159.4 cm) Ht Readings from Last 3 Encounters:  09/20/15 5' 2.75" (1.594 m)  12/30/14 5' 2.75" (1.594 m)  09/22/14 5' 2.75" (1.594 m)    General appearance: alert, cooperative and appears stated age Head: Normocephalic, without obvious abnormality, atraumatic Neck: no adenopathy, supple, symmetrical, trachea midline and thyroid normal to inspection and palpation Lungs: clear to auscultation bilaterally Breasts: normal appearance, no masses or tenderness, No nipple retraction or dimpling, No nipple discharge or bleeding, No axillary or supraclavicular adenopathy Heart: regular rate and rhythm Abdomen: soft, non-tender; no masses,  no organomegaly, firm on left above pubic bone, history of fibroids Extremities: extremities normal, atraumatic, no cyanosis or edema Skin: Skin color, texture, turgor normal. No rashes or lesions Lymph nodes: Cervical, supraclavicular, and axillary nodes normal. No abnormal inguinal nodes palpated Neurologic: Grossly normal   Pelvic: External genitalia:  no lesions              Urethra:  normal appearing urethra with no masses, tenderness or lesions              Bartholin's and Skene's: normal                 Vagina: normal appearing vagina with normal color and discharge, no lesions  Cervix: normal,non tender ,no lesions              Pap taken: No. Bimanual Exam:  Uterus:  enlarged, 12-14 weeks size and tilts to left slightly nodular, non tender              Adnexa: normal adnexa, no mass, fullness, tenderness and unable palpate adnexa on left due to uterine position and size               Rectovaginal: Confirms               Anus:  normal sphincter tone, no lesions  Chaperone present: yes  A:  Well Woman with normal exam  Contraception POP due to smoking, desires continuance  History of anemia off iron supplement  History of fibroids with size change  STD screening, R/O vaginal  infection  Immunization update  P:   Reviewed health and wellness pertinent to exam  Rx Micronor see order with instructions  With HGB 10.3 needs to start back on Fusion Plus, which is probably her fatigue. Discussed importance of daily Vitamin C for increase absorption of iron and iron foods. Questions addressed.   Rx Fusion Plus with instructions  Labs: Iron, IBC,Ferritin, CBC  Discussed size change and need for evaluation with PUS again. Discussed may have continued to enlarged or develop another problem that needs to be evaluated. questions addressed. Patient agreeable. Patient aware she will be called with insurance info and scheduled. Order placed  Labs:HIV,RPR,GC,Chlamydia,Affirm  Will treat per affirm results if indicated, patient can do aveeno sitz bath if needed.   Requests TDAP  Pap smear as above not taken   counseled on breast self exam, STD prevention, HIV risk factors and prevention, use and side effects of OCP's, adequate intake of calcium and vitamin D, diet and exercise  return annually or prn  An After Visit Summary was printed and given to the patient.

## 2015-09-21 ENCOUNTER — Other Ambulatory Visit: Payer: Self-pay | Admitting: Certified Nurse Midwife

## 2015-09-21 ENCOUNTER — Telehealth: Payer: Self-pay

## 2015-09-21 DIAGNOSIS — N760 Acute vaginitis: Principal | ICD-10-CM

## 2015-09-21 DIAGNOSIS — D509 Iron deficiency anemia, unspecified: Secondary | ICD-10-CM

## 2015-09-21 DIAGNOSIS — B9689 Other specified bacterial agents as the cause of diseases classified elsewhere: Secondary | ICD-10-CM

## 2015-09-21 LAB — RPR

## 2015-09-21 LAB — WET PREP BY MOLECULAR PROBE
CANDIDA SPECIES: NEGATIVE
GARDNERELLA VAGINALIS: POSITIVE — AB
Trichomonas vaginosis: NEGATIVE

## 2015-09-21 LAB — HIV ANTIBODY (ROUTINE TESTING W REFLEX): HIV: NONREACTIVE

## 2015-09-21 MED ORDER — HYLAFEM VA SUPP: 1.0000 | Freq: Every day | VAGINAL | Status: DC

## 2015-09-21 NOTE — Telephone Encounter (Signed)
-----   Message from Regina Eck, CNM sent at 09/21/2015 10:38 AM EDT ----- Notify patient that the affirm was positive for BV, negative for yeast and Trichomonas.  Order in for Hylafem please give instructions RPR, HIV are negative,  GC,Chlamydia pending Iron level is very low  At less than 10 normal is >40 Saturation is 1 normal is 11-50 % Ferritin is 6  Normal is 10 or greater CBC shows anemia profile Needs to take Fusion Plus( order placed at [facility]) twice daily with these results Recheck iron level 6 weeks

## 2015-09-21 NOTE — Telephone Encounter (Signed)
Patient returning call.

## 2015-09-21 NOTE — Telephone Encounter (Signed)
Patient notified of results. See lab 

## 2015-09-21 NOTE — Telephone Encounter (Signed)
Left message to call back  

## 2015-09-21 NOTE — Telephone Encounter (Signed)
lmtcb

## 2015-09-22 LAB — IPS N GONORRHOEA AND CHLAMYDIA BY PCR

## 2015-09-25 ENCOUNTER — Telehealth: Payer: Self-pay | Admitting: Obstetrics and Gynecology

## 2015-09-25 NOTE — Telephone Encounter (Signed)
Called patient to review benefits for procedure. Left voicemail to call back and review. °

## 2015-10-20 NOTE — Telephone Encounter (Deleted)
Reviewed benefit with patient. Patient understands but does not wish to schedule at this time. Routing to clinical for review.

## 2015-10-24 ENCOUNTER — Telehealth: Payer: Self-pay | Admitting: *Deleted

## 2015-11-02 ENCOUNTER — Telehealth: Payer: Self-pay | Admitting: Emergency Medicine

## 2015-11-02 ENCOUNTER — Encounter: Payer: Self-pay | Admitting: Certified Nurse Midwife

## 2015-11-02 NOTE — Telephone Encounter (Signed)
Called patient for telephone triage. Please see telephone encounter.

## 2015-11-02 NOTE — Telephone Encounter (Signed)
Chief Complaint  Patient presents with  . Advice Only    Patient sent mychart request.     ===View-only below this line===   ----- Message -----    FromLuanne Hanna    Sent: 11/02/2015 12:22 PM EST      To: [name] CNM Subject: Non-Urgent Medical Question  Dr. Hollice Espy, On my last visit I was prescribed hylafem suppositories,I used those as instructed and had some temporary relief. I'm beginning again to have some reoccurrence of symptoms. Is there an over the counter medicine that you suggest? Angela Hanna

## 2015-11-02 NOTE — Telephone Encounter (Signed)
Routing to [employer] CNM.

## 2015-11-02 NOTE — Telephone Encounter (Signed)
She has a refill on her Hylafem if needed.

## 2015-11-02 NOTE — Telephone Encounter (Signed)
Patient sent mychart message. She states she completed 6 days of treatment with Hylafem. Symptoms improved initially, however, vaginal discharge and odor has returned. No pain or fevers. No vaginal itching or irritation. Advised there are not any OTC treatments for bacterial vaginosis but discussed can use Aveeno Oatmeal sitz baths for help with vaginal discharge and odor. Patient offered office visit and accepts, however, cannot take appointment until after Thanksgiving holiday. Scheduled office visit at patient request for date and time 11/15/15 at 1000. Patient advised to call back with any concerns prior to appointment or if chooses to schedule earlier appointment and is agreeable. Routing to provider for final review. Patient agreeable to disposition. Will close encounter.

## 2015-11-03 NOTE — Telephone Encounter (Signed)
Patient used both fills of Hylafem.

## 2015-11-13 NOTE — Telephone Encounter (Signed)
lmtcb about canceled appointment with DL.

## 2015-11-15 ENCOUNTER — Ambulatory Visit: Payer: BLUE CROSS/BLUE SHIELD | Admitting: Certified Nurse Midwife

## 2015-11-15 NOTE — Telephone Encounter (Signed)
Follow up call to patient. Left message to call back. Calling to reschedule appointment canceled by provider and follow-up on recommended pelvic ultrasound.

## 2015-11-15 NOTE — Telephone Encounter (Signed)
Angela Hanna,  Please continue to try to reach patient to schedule Pelvic ultrasound.

## 2015-12-26 NOTE — Telephone Encounter (Signed)
Follow-up call to patient. VM confirms phone number. Left message to call back.

## 2016-01-02 ENCOUNTER — Encounter: Payer: Self-pay | Admitting: Emergency Medicine

## 2016-01-02 NOTE — Telephone Encounter (Signed)
No response from patient. Letter to your office for review.  

## 2016-01-02 NOTE — Telephone Encounter (Signed)
Message left to return call to Cully Luckow at [phone].    

## 2016-01-02 NOTE — Telephone Encounter (Signed)
I have personally reviewed this patien's chart.  She had an ultrasound in 2015 which documented several fibroid, many of which were large.  I do not think she needs to have a pelvic ultrasound done at this time.  I would remove the order.  I don't think she needs a letter in follow up.  She has an annual scheduled for the fall with Evalee Mutton.

## 2016-01-02 NOTE — Telephone Encounter (Signed)
Note sent through to Triage, so this is a repeat note. I reviewed this patient's chart.  She has a known multifibroid uterus.  I saw her at the end of 2015 at which time she had several large fibroids.  She has had follow up with me in January 2016 and then with Evalee Mutton for her annual exam.  I do not think she needs a repeat ultrasound at this time or a letter sent.  She has her annual scheduled for the fall 2017 with Debbie.  [place]

## 2016-01-02 NOTE — Telephone Encounter (Signed)
Dr. Quincy Simmonds,  Patient has not returned calls from insurance department regarding schedule Pelvic ultrasound.  This was ordered on 09/20/15 by [name] CNM.  Okay to send letter and DC order?

## 2016-01-03 ENCOUNTER — Encounter: Payer: Self-pay | Admitting: Emergency Medicine

## 2016-01-03 NOTE — Telephone Encounter (Signed)
Noted message from Dr. Quincy Simmonds.  Letter DC, Order DC.

## 2016-01-03 NOTE — Telephone Encounter (Signed)
Order DC and letter not sent.  Encounter closed.

## 2016-09-20 ENCOUNTER — Ambulatory Visit: Payer: BLUE CROSS/BLUE SHIELD | Admitting: Certified Nurse Midwife

## 2016-09-24 ENCOUNTER — Ambulatory Visit: Payer: BLUE CROSS/BLUE SHIELD | Admitting: Certified Nurse Midwife

## 2016-10-08 ENCOUNTER — Encounter: Payer: Self-pay | Admitting: Certified Nurse Midwife

## 2016-10-08 ENCOUNTER — Ambulatory Visit (INDEPENDENT_AMBULATORY_CARE_PROVIDER_SITE_OTHER): Payer: BLUE CROSS/BLUE SHIELD | Admitting: Certified Nurse Midwife

## 2016-10-08 VITALS — BP 120/70 | HR 72 | Resp 16 | Ht 62.25 in | Wt 171.0 lb

## 2016-10-08 DIAGNOSIS — N852 Hypertrophy of uterus: Secondary | ICD-10-CM

## 2016-10-08 DIAGNOSIS — Z86018 Personal history of other benign neoplasm: Secondary | ICD-10-CM | POA: Diagnosis not present

## 2016-10-08 DIAGNOSIS — Z0000 Encounter for general adult medical examination without abnormal findings: Secondary | ICD-10-CM

## 2016-10-08 DIAGNOSIS — Z01419 Encounter for gynecological examination (general) (routine) without abnormal findings: Secondary | ICD-10-CM | POA: Diagnosis not present

## 2016-10-08 DIAGNOSIS — Z124 Encounter for screening for malignant neoplasm of cervix: Secondary | ICD-10-CM

## 2016-10-08 LAB — CBC
HEMATOCRIT: 39.7 % (ref 35.0–45.0)
HEMOGLOBIN: 12.3 g/dL (ref 11.7–15.5)
MCH: 29.1 pg (ref 27.0–33.0)
MCHC: 31 g/dL — AB (ref 32.0–36.0)
MCV: 94.1 fL (ref 80.0–100.0)
MPV: 10.2 fL (ref 7.5–12.5)
Platelets: 287 10*3/uL (ref 140–400)
RBC: 4.22 MIL/uL (ref 3.80–5.10)
RDW: 14.9 % (ref 11.0–15.0)
WBC: 7 10*3/uL (ref 3.8–10.8)

## 2016-10-08 LAB — POCT URINALYSIS DIPSTICK
BILIRUBIN UA: NEGATIVE
Blood, UA: NEGATIVE
GLUCOSE UA: NEGATIVE
Ketones, UA: NEGATIVE
LEUKOCYTES UA: NEGATIVE
NITRITE UA: NEGATIVE
PH UA: 5
Protein, UA: NEGATIVE
Urobilinogen, UA: NEGATIVE

## 2016-10-08 NOTE — Patient Instructions (Signed)
EXERCISE AND DIET:  We recommended that you start or continue a regular exercise program for good health. Regular exercise means any activity that makes your heart beat faster and makes you sweat.  We recommend exercising at least 30 minutes per day at least 3 days a week, preferably 4 or 5.  We also recommend a diet low in fat and sugar.  Inactivity, poor dietary choices and obesity can cause diabetes, heart attack, stroke, and kidney damage, among others.    ALCOHOL AND SMOKING:  Women should limit their alcohol intake to no more than 7 drinks/beers/glasses of wine (combined, not each!) per week. Moderation of alcohol intake to this level decreases your risk of breast cancer and liver damage. And of course, no recreational drugs are part of a healthy lifestyle.  And absolutely no smoking or even second hand smoke. Most people know smoking can cause heart and lung diseases, but did you know it also contributes to weakening of your bones? Aging of your skin?  Yellowing of your teeth and nails?  CALCIUM AND VITAMIN D:  Adequate intake of calcium and Vitamin D are recommended.  The recommendations for exact amounts of these supplements seem to change often, but generally speaking 600 mg of calcium (either carbonate or citrate) and 800 units of Vitamin D per day seems prudent. Certain women may benefit from higher intake of Vitamin D.  If you are among these women, your doctor will have told you during your visit.    PAP SMEARS:  Pap smears, to check for cervical cancer or precancers,  have traditionally been done yearly, although recent scientific advances have shown that most women can have pap smears less often.  However, every woman still should have a physical exam from her gynecologist every year. It will include a breast check, inspection of the vulva and vagina to check for abnormal growths or skin changes, a visual exam of the cervix, and then an exam to evaluate the size and shape of the uterus and  ovaries.  And after 38 years of age, a rectal exam is indicated to check for rectal cancers. We will also provide age appropriate advice regarding health maintenance, like when you should have certain vaccines, screening for sexually transmitted diseases, bone density testing, colonoscopy, mammograms, etc.   MAMMOGRAMS:  All women over 40 years old should have a yearly mammogram. Many facilities now offer a "3D" mammogram, which may cost around $50 extra out of pocket. If possible,  we recommend you accept the option to have the 3D mammogram performed.  It both reduces the number of women who will be called back for extra views which then turn out to be normal, and it is better than the routine mammogram at detecting truly abnormal areas.    COLONOSCOPY:  Colonoscopy to screen for colon cancer is recommended for all women at age 50.  We know, you hate the idea of the prep.  We agree, BUT, having colon cancer and not knowing it is worse!!  Colon cancer so often starts as a polyp that can be seen and removed at colonscopy, which can quite literally save your life!  And if your first colonoscopy is normal and you have no family history of colon cancer, most women don't have to have it again for 10 years.  Once every ten years, you can do something that may end up saving your life, right?  We will be happy to help you get it scheduled when you are ready.    Be sure to check your insurance coverage so you understand how much it will cost.  It may be covered as a preventative service at no cost, but you should check your particular policy.      Fibromas uterinos (Uterine Fibroids) Los fibromas uterinos son Cherrie Gauze (tumores) de tejido. Tambin se los [employer]. Pueden desarrollarse dentro del abdomen de Musician (tero) y crecer hasta volverse muy grandes. Los fibromas no son cancerosos (benignos). La mayora no requiere tratamiento mdico. CUIDADOS EN EL HOGAR  Concurra a todas las visitas de control  como se lo haya indicado el mdico. Esto es importante.  Tome los medicamentos solamente como se lo haya indicado el mdico.  Si le recetaron un tratamiento hormonal, tome los medicamentos hormonales exactamente como se lo indicaron.  No tome aspirina. Puede ocasionar hemorragias.  Consulte al [employer] sobre tomar comprimidos de hierro y Garment/textile technologist la cantidad de verduras de hoja color verde oscuro en la dieta. Estas medidas pueden ayudar a incrementar los niveles de Tax adviser.  Preste mucha atencin a Hydrographic surveyor. Informe al mdico si hay algn cambio, por ejemplo:  Aumento del flujo de [place]. Esto puede exigirle el uso de ms compresas o tampones que los que [place] normalmente cada mes.  Un cambio en la cantidad de [employer] dura la menstruacin cada mes.  Un cambio en los sntomas que se manifiestan con la menstruacin, como dolor de espalda o clicos en la zona del vientre (abdomen). SOLICITE AYUDA SI:  Siente dolor de espalda o en la zona que se encuentra entre los huesos de la cadera (zona plvica) que los medicamentos no Transport planner.  Siente dolor en el abdomen que los medicamentos no Transport planner.  Observa un aumento del sangrado entre y [employer].  Empapa los tampones o las compresas en el trmino de media hora o [place].  Se siente mareada.  Se siente muy cansado.  Se siente dbil. SOLICITE AYUDA DE INMEDIATO SI:   Pierde el conocimiento (se desmaya).  El dolor plvico aumenta repentinamente.   Esta informacin no tiene Marine scientist el consejo del mdico. Asegrese de hacerle al mdico cualquier pregunta que tenga.   Document Released: 03/19/2011 Document Revised: 12/23/2014 Elsevier Interactive Patient Education [employer].

## 2016-10-08 NOTE — Progress Notes (Signed)
40 y.o. G0P0000 Single  African American Fe here for annual exam.  Periods normal, regular  26 days. No issues in the past year. Some early morning nausea occasional, no concerns at this point. No partner change, desires STD Screening.  Patient's last menstrual period was 09/23/2016 (exact date).          Sexually active: Yes.    The current method of family planning is condom use most of the time    Exercising: Yes.    cardio & weights Smoker:  yes  Health Maintenance: Pap:  09-05-14 neg HPV HR neg MMG:  none Colonoscopy:  none BMD:   none TDaP:  2016 Shingles: no Pneumonia: no Hep C and HIV: HIV neg 2016 Labs: poct urine-neg Self breast exam: done occ   reports that she has been smoking.  She has never used smokeless tobacco. She reports that she drinks about 1.2 - 1.8 oz of alcohol per week . She reports that she does not use drugs.  Past Medical History:  Diagnosis Date  . Abnormal Pap smear of cervix    yrs ago, pt believes she had a procedure done for this  . Anemia     History reviewed. No pertinent surgical history.  Current Outpatient Prescriptions  Medication Sig Dispense Refill  . Multiple Vitamins-Minerals (MULTIVITAMIN PO) Take by mouth daily.     No current facility-administered medications for this visit.     Family History  Problem Relation Age of Onset  . Diabetes Mother   . Hypertension Mother   . Cancer Maternal Grandfather     prostate  . Cancer Paternal Grandmother     lung    ROS:  Pertinent items are noted in HPI.  Otherwise, a comprehensive ROS was negative.  Exam:   BP 120/70   Pulse 72   Resp 16   Ht 5' 2.25" (1.581 m)   Wt 171 lb (77.6 kg)   LMP 09/23/2016 (Exact Date)   BMI 31.03 kg/m  Height: 5' 2.25" (158.1 cm) Ht Readings from Last 3 Encounters:  10/08/16 5' 2.25" (1.581 m)  09/20/15 5' 2.75" (1.594 m)  12/30/14 5' 2.75" (1.594 m)    General appearance: alert, cooperative and appears stated age Head: Normocephalic,  without obvious abnormality, atraumatic Neck: no adenopathy, supple, symmetrical, trachea midline and thyroid normal to inspection and palpation Lungs: clear to auscultation bilaterally Breasts: normal appearance, no masses or tenderness, No nipple retraction or dimpling, No nipple discharge or bleeding, No axillary or supraclavicular adenopathy Heart: regular rate and rhythm Abdomen: soft, non-tender; no masses,  no organomegaly Extremities: extremities normal, atraumatic, no cyanosis or edema Skin: Skin color, texture, turgor normal. No rashes or lesions Lymph nodes: Cervical, supraclavicular, and axillary nodes normal. No abnormal inguinal nodes palpated Neurologic: Grossly normal   Pelvic: External genitalia:  no lesions              Urethra:  normal appearing urethra with no masses, tenderness or lesions              Bartholin's and Skene's: normal                 Vagina: normal appearing vagina with normal color and discharge, no lesions              Cervix: no bleeding following Pap, no cervical motion tenderness and no lesions              Pap taken: Yes.   Bimanual Exam:  Uterus:  enlarged, 12-14 weeks size tilts to left              Adnexa: normal adnexa and no mass, fullness, tenderness               Rectovaginal: Confirms               Anus:  normal sphincter tone, no lesions  Chaperone present: yes  A:  Well Woman with normal exam  Contraception condoms consistent  Enlarged uterus with known fibroids no size change or symptom change  Screening labs desired  P:   Reviewed health and wellness pertinent to exam  Discussed no change in uterine size from previous exam. Discussed fertility and plans for childbearing, patient not sure if she will pursue. Discussed would recommend her to infertility for evaluation if she desired due to fibroid history. Patient will advise if she feels she wants to do this. Warning signs of fibroids given and need to advise.  Labs: GC,Chlamydia,  HIV, RPR, lipid panel, Vitamin D, CBC, Iron, affirm  Pap smear as above with HPVHR   counseled on breast self exam, STD prevention, HIV risk factors and prevention, adequate intake of calcium and vitamin D, diet and exercise  return annually or prn  An After Visit Summary was printed and given to the patient.

## 2016-10-09 LAB — VITAMIN D 25 HYDROXY (VIT D DEFICIENCY, FRACTURES): Vit D, 25-Hydroxy: 28 ng/mL — ABNORMAL LOW (ref 30–100)

## 2016-10-09 LAB — LIPID PANEL
CHOL/HDL RATIO: 2.1 ratio (ref <=5.0)
Cholesterol: 150 mg/dL (ref 125–200)
HDL: 73 mg/dL (ref >=46)
LDL CALC: 56 mg/dL (ref <130)
TRIGLYCERIDES: 106 mg/dL (ref <150)
VLDL: 21 mg/dL (ref <30)

## 2016-10-09 LAB — IRON: Iron: 78 ug/dL (ref 40–190)

## 2016-10-09 LAB — WET PREP BY MOLECULAR PROBE
CANDIDA SPECIES: NEGATIVE
GARDNERELLA VAGINALIS: NEGATIVE
TRICHOMONAS VAG: NEGATIVE

## 2016-10-09 LAB — HIV ANTIBODY (ROUTINE TESTING W REFLEX): HIV 1&2 Ab, 4th Generation: NONREACTIVE

## 2016-10-09 LAB — RPR

## 2016-10-10 LAB — IPS N GONORRHOEA AND CHLAMYDIA BY PCR

## 2016-10-10 LAB — IPS PAP TEST WITH HPV

## 2016-10-11 NOTE — Progress Notes (Signed)
Encounter reviewed Jill Jertson, MD   

## 2017-10-09 NOTE — Progress Notes (Signed)
41 y.o. G0P0000 Single  African American Fe here for annual exam. Periods normal no issues. Would like to be back on POP. Aware of bleeding profile worked well with her before. Some increase in discharge, not symptomatic. Sees Urgent care if needed.  No partner change. Would like STD screening.Trying to take better care of herself this year. Plans to establish with dentist for neglected mouth care. No other health issues today.  Patient's last menstrual period was 09/15/2017.          Sexually active: Yes.    The current method of family planning is condoms sometimes.    Exercising: Yes.    cardio Smoker:  yes  Health Maintenance: Pap:  09-05-14 neg HPV HR neg, 10-08-16 neg HPV HR neg History of Abnormal Pap: yes, years ago MMG:  none Self Breast exams: yes Colonoscopy:  none BMD:   none TDaP:  2016 Shingles: no Pneumonia: no Hep C and HIV: HIV neg 2017 Labs: discuss today   reports that she has been smoking.  She has never used smokeless tobacco. She reports that she drinks about 1.2 - 1.8 oz of alcohol per week . She reports that she does not use drugs.  Past Medical History:  Diagnosis Date  . Abnormal Pap smear of cervix    yrs ago, pt believes she had a procedure done for this  . Anemia     History reviewed. No pertinent surgical history.  Current Outpatient Prescriptions  Medication Sig Dispense Refill  . Multiple Vitamins-Minerals (MULTIVITAMIN PO) Take by mouth daily.     No current facility-administered medications for this visit.     Family History  Problem Relation Age of Onset  . Diabetes Mother   . Hypertension Mother   . Cancer Maternal Grandfather        prostate  . Cancer Paternal Grandmother        lung    ROS:  Pertinent items are noted in HPI.  Otherwise, a comprehensive ROS was negative.  Exam:   BP 124/70 (BP Location: Right Arm, Patient Position: Sitting, Cuff Size: Normal)   Pulse 84   Resp 16   Ht 5' 2.5" (1.588 m)   Wt 174 lb 9.6 oz  (79.2 kg)   LMP 09/15/2017   BMI 31.43 kg/m  Height: 5' 2.5" (158.8 cm) Ht Readings from Last 3 Encounters:  10/10/17 5' 2.5" (1.588 m)  10/08/16 5' 2.25" (1.581 m)  09/20/15 5' 2.75" (1.594 m)    General appearance: alert, cooperative and appears stated age Head: Normocephalic, without obvious abnormality, atraumatic Neck: no adenopathy, supple, symmetrical, trachea midline and thyroid normal to inspection and palpation Lungs: clear to auscultation bilaterally Breasts: normal appearance, no masses or tenderness, No nipple retraction or dimpling, No nipple discharge or bleeding, No axillary or supraclavicular adenopathy Heart: regular rate and rhythm Abdomen: soft, non-tender; no masses,  no organomegaly Extremities: extremities normal, atraumatic, no cyanosis or edema Skin: Skin color, texture, turgor normal. No rashes or lesions Lymph nodes: Cervical, supraclavicular, and axillary nodes normal. No abnormal inguinal nodes palpated Neurologic: Grossly normal   Pelvic: External genitalia:  no lesions              Urethra:  normal appearing urethra with no masses, tenderness or lesions              Bartholin's and Skene's: normal                 Vagina: normal appearing vagina with normal  color and discharge, no lesions              Cervix: no cervical motion tenderness, no lesions and normal appearance              Pap taken: No. Bimanual Exam:  Uterus:  normal size, contour, position, consistency, mobility, non-tender              Adnexa: normal adnexa and no mass, fullness, tenderness               Rectovaginal: Confirms               Anus:  normal sphincter tone, no lesions  Chaperone present: yes  A:  Well Woman with normal exam  Contraception condoms, no consistent, desires POP again.  Screening labs and STD screening   Dental care needed.  Smoker, declines cessation information  P:   Reviewed health and wellness pertinent to exam  Discussed risks/benefits/side  effects/warning signs and bleeding profile expectations. Patient voiced understanding and desires again.  Rx Camilla see order with instructions, start on first day of next period  Discussed [facility] dental cleaning option and she just needs to call. Patient thankful for information.  Encouraged to reduce smoking amount and reduction of health concerns with every cigarette she does not smoke.  Labs:Hep B, HIV,RPR, GC/chlamydia, Vaginal screen  Pap smear: no   counseled on breast self exam, mammography screening starting at age 21, STD prevention, HIV risk factors and prevention, adequate intake of calcium and vitamin D, diet and exercise  return annually or prn  An After Visit Summary was printed and given to the patient.

## 2017-10-10 ENCOUNTER — Ambulatory Visit (INDEPENDENT_AMBULATORY_CARE_PROVIDER_SITE_OTHER): Payer: BLUE CROSS/BLUE SHIELD | Admitting: Certified Nurse Midwife

## 2017-10-10 ENCOUNTER — Encounter: Payer: Self-pay | Admitting: Certified Nurse Midwife

## 2017-10-10 VITALS — BP 124/70 | HR 84 | Resp 16 | Ht 62.5 in | Wt 174.6 lb

## 2017-10-10 DIAGNOSIS — Z0000 Encounter for general adult medical examination without abnormal findings: Secondary | ICD-10-CM

## 2017-10-10 DIAGNOSIS — N898 Other specified noninflammatory disorders of vagina: Secondary | ICD-10-CM | POA: Diagnosis not present

## 2017-10-10 DIAGNOSIS — Z01419 Encounter for gynecological examination (general) (routine) without abnormal findings: Secondary | ICD-10-CM

## 2017-10-10 DIAGNOSIS — Z113 Encounter for screening for infections with a predominantly sexual mode of transmission: Secondary | ICD-10-CM

## 2017-10-10 DIAGNOSIS — Z30011 Encounter for initial prescription of contraceptive pills: Secondary | ICD-10-CM | POA: Diagnosis not present

## 2017-10-10 MED ORDER — NORETHINDRONE 0.35 MG PO TABS: 1.0000 | ORAL_TABLET | Freq: Every day | ORAL | 4 refills | Status: DC

## 2017-10-10 NOTE — Patient Instructions (Signed)

## 2017-10-11 LAB — HEP, RPR, HIV PANEL
HIV Screen 4th Generation wRfx: NONREACTIVE
Hepatitis B Surface Ag: NEGATIVE
RPR: NONREACTIVE

## 2017-10-11 LAB — VAGINITIS/VAGINOSIS, DNA PROBE
Candida Species: NEGATIVE
Gardnerella vaginalis: POSITIVE — AB
TRICHOMONAS VAG: NEGATIVE

## 2017-10-13 LAB — GC/CHLAMYDIA PROBE AMP
Chlamydia trachomatis, NAA: NEGATIVE
NEISSERIA GONORRHOEAE BY PCR: NEGATIVE

## 2017-10-14 ENCOUNTER — Other Ambulatory Visit: Payer: Self-pay

## 2017-10-14 ENCOUNTER — Telehealth: Payer: Self-pay

## 2017-10-14 MED ORDER — METRONIDAZOLE 500 MG PO TABS: 500.0000 mg | ORAL_TABLET | Freq: Two times a day (BID) | ORAL | 0 refills | Status: DC

## 2017-10-14 NOTE — Telephone Encounter (Signed)
-----   Message from Regina Eck, CNM sent at 10/13/2017 11:15 AM EDT ----- Notify patient that GC and Chlamydia are negative Vaginal screen positive for BV only Will need Rx Flagyl 500 mg Bid x 7 days for treatment. Has she been using consistent condoms. Not recommended if concerns of pregnancy and would need to wait until menses to treat. Yeast and trichomonas negative HIV,RPR, Hep B negative

## 2017-10-14 NOTE — Telephone Encounter (Signed)
Return cal to [place].

## 2017-10-14 NOTE — Telephone Encounter (Signed)
Patient notified of results as written by provider 

## 2017-10-14 NOTE — Telephone Encounter (Signed)
lmtcb

## 2017-12-31 NOTE — Progress Notes (Signed)
42 y.o. Single African American female G0P0000 here with complaint of vaginal symptoms of itching, burning, external tissue for about week.Marland Kitchen Describes discharge as normal appearance and no odor. She also thought she might have  a bump in the area and felt this has resolve.  Denies new personal products or vaginal dryness. no STD concerns. Not sexually active.. Urinary symptoms none . Contraception is OCP, with cycle of 22 days on pill cycle and some random spotting.  ROS  O:Healthy female WDWN Affect: normal, orientation x 3  Exam:Skin: warm and dry Abdomen: soft, non tender, uterine enlargement with palpated at 14- week size  inguinal Lymph nodes: no enlargement or tenderness Pelvic exam: External genital: normal female with small sebaceous cyst noted at bottom of left labia, no redness or tenderness or exudate, scaling noted on skin  BUS: negative Vagina: thick white non odorous discharge noted. Ph:4.0   ,Wet prep taken, Cervix: normal, non tender, no CMT Uterus: normal, non tender, enlarged 14 week size( no change from previous exam Adnexa:normal, non tender, no masses or fullness noted   Wet Prep results:KOH, Saline + yeast only   A:Normal pelvic exam Yeast vaginitis/vulvitis Uterine enlargement history of fibroids, no size change from last exam Contraception POP working well, normal bleeding profile   P:Discussed findings of yeast vaginitis/vulvitis and etiology. Discussed Aveeno or baking soda sitz bath for comfort. Avoid moist clothes for extended period of time. If working out in gym clothes or swim suits for long periods of time change underwear or bottoms of swimsuit if possible. Coconut Oil use for skin protection prior to activity can be used to external skin for protection. Rx: Diflucan see order with instructions Rx Mycolog ointment see order with instructions  Rv prn

## 2018-01-01 ENCOUNTER — Other Ambulatory Visit: Payer: Self-pay

## 2018-01-01 ENCOUNTER — Encounter: Payer: Self-pay | Admitting: Certified Nurse Midwife

## 2018-01-01 ENCOUNTER — Ambulatory Visit: Payer: BLUE CROSS/BLUE SHIELD | Admitting: Certified Nurse Midwife

## 2018-01-01 VITALS — BP 122/78 | HR 68 | Resp 16 | Ht 62.5 in | Wt 178.0 lb

## 2018-01-01 DIAGNOSIS — B3731 Acute candidiasis of vulva and vagina: Secondary | ICD-10-CM

## 2018-01-01 DIAGNOSIS — B373 Candidiasis of vulva and vagina: Secondary | ICD-10-CM

## 2018-01-01 MED ORDER — FLUCONAZOLE 150 MG PO TABS: ORAL_TABLET | ORAL | 0 refills | Status: DC

## 2018-01-01 MED ORDER — NYSTATIN-TRIAMCINOLONE 100000-0.1 UNIT/GM-% EX OINT: TOPICAL_OINTMENT | CUTANEOUS | 1 refills | Status: DC

## 2018-01-01 NOTE — Patient Instructions (Signed)

## 2018-06-30 ENCOUNTER — Ambulatory Visit: Payer: BLUE CROSS/BLUE SHIELD | Admitting: Sports Medicine

## 2018-06-30 ENCOUNTER — Encounter: Payer: Self-pay | Admitting: Sports Medicine

## 2018-06-30 VITALS — BP 113/78 | HR 91 | Resp 16

## 2018-06-30 DIAGNOSIS — M79672 Pain in left foot: Secondary | ICD-10-CM

## 2018-06-30 DIAGNOSIS — M79671 Pain in right foot: Secondary | ICD-10-CM | POA: Diagnosis not present

## 2018-06-30 DIAGNOSIS — Q828 Other specified congenital malformations of skin: Secondary | ICD-10-CM

## 2018-06-30 DIAGNOSIS — M216X9 Other acquired deformities of unspecified foot: Secondary | ICD-10-CM

## 2018-06-30 DIAGNOSIS — L84 Corns and callosities: Secondary | ICD-10-CM

## 2018-06-30 NOTE — Progress Notes (Signed)
Subjective: Angela Hanna is a 42 y.o. female patient who presents to office for evaluation of Right> Left foot pain secondary to callus skin. Patient complains of pain at the lesion present Right>Left foot at the sub met 1 and 5. Patient has tried self trimming and pedicure with no relief in symptoms. Patient denies any other pedal complaints.   Review of Systems  All other systems reviewed and are negative.   Patient Active Problem List   Diagnosis Date Noted  . Fibroids 09/22/2014    Current Outpatient Medications on File Prior to Visit  Medication Sig Dispense Refill  . fluconazole (DIFLUCAN) 150 MG tablet Take one tablet.  Repeat in on tablet in 5 days. 2 tablet 0  . Multiple Vitamins-Minerals (MULTIVITAMIN PO) Take by mouth daily.    . norethindrone (MICRONOR,CAMILA,ERRIN) 0.35 MG tablet Take 1 tablet (0.35 mg total) by mouth daily. 3 Package 4  . nystatin-triamcinolone ointment (MYCOLOG) Apply to affected area thinly twice daily up to 7 days 30 g 1   No current facility-administered medications on file prior to visit.     No Known Allergies  Objective:  General: Alert and oriented x3 in no acute distress  Dermatology: Keratotic lesion present sub met 1 & 5 with skin lines transversing the lesion, pain is present with direct pressure to the lesion with a central nucleated core noted, no webspace macerations, no ecchymosis bilateral, all nails x 10 are well manicured.  Vascular: Dorsalis Pedis and Posterior Tibial pedal pulses 2/4, Capillary Fill Time 3 seconds, + pedal hair growth bilateral, no edema bilateral lower extremities, Temperature gradient within normal limits.  Neurology: Johney Maine sensation intact via light touch bilateral.  Musculoskeletal: Mild tenderness with palpation at the keratotic lesion sites on Right>Left, Muscular strength 5/5 in all groups without pain or limitation on range of motion. Prominent met heads bilateral.   Assessment and Plan: Problem List  Items Addressed This Visit    None    Visit Diagnoses    Skin callus    -  Primary   Porokeratosis       Prominent metatarsal head, unspecified laterality       Foot pain, bilateral          -Complete examination performed -Discussed treatment options -Parred keratoic lesion using a chisel blade x4; treated the area withSalinocaine covered with moleskin -Encouraged daily skin emollients -Encouraged use of pumice stone -Advised good supportive shoes and recommended custom insoles to help offload sub 1 and 5 bilateral; Office to check insurance benefits and schedule for casting -Patient to return to office for orthotics or sooner if condition worsens.  Landis Martins, DPM

## 2018-07-03 ENCOUNTER — Telehealth: Payer: Self-pay | Admitting: Sports Medicine

## 2018-07-03 NOTE — Telephone Encounter (Signed)
Left message for pt to call to discuss orthotics coverage per Dr Cannon Kettle.Marland KitchenMarland Kitchen

## 2018-07-13 NOTE — Telephone Encounter (Signed)
Pt returned call on 7.26.19 and left message.  I returned call and told her the orthotics are not covered unless diabetic and the cost is 398.00 or if she wanted something less expensive  Dr Cannon Kettle said she could try powersteps that are 55.00 plus tax. She is going to think about it and call me back.

## 2018-10-22 ENCOUNTER — Ambulatory Visit: Payer: BLUE CROSS/BLUE SHIELD | Admitting: Certified Nurse Midwife

## 2018-11-26 ENCOUNTER — Encounter: Payer: Self-pay | Admitting: Certified Nurse Midwife

## 2018-11-26 ENCOUNTER — Ambulatory Visit: Payer: BLUE CROSS/BLUE SHIELD | Admitting: Certified Nurse Midwife

## 2018-11-26 ENCOUNTER — Other Ambulatory Visit: Payer: Self-pay

## 2018-11-26 VITALS — BP 110/78 | HR 68 | Resp 16 | Ht 62.75 in | Wt 175.0 lb

## 2018-11-26 DIAGNOSIS — Z86018 Personal history of other benign neoplasm: Secondary | ICD-10-CM

## 2018-11-26 DIAGNOSIS — Z3041 Encounter for surveillance of contraceptive pills: Secondary | ICD-10-CM | POA: Diagnosis not present

## 2018-11-26 DIAGNOSIS — Z01419 Encounter for gynecological examination (general) (routine) without abnormal findings: Secondary | ICD-10-CM | POA: Diagnosis not present

## 2018-11-26 DIAGNOSIS — N852 Hypertrophy of uterus: Secondary | ICD-10-CM

## 2018-11-26 MED ORDER — NORETHINDRONE 0.35 MG PO TABS: 1.0000 | ORAL_TABLET | Freq: Every day | ORAL | 4 refills | Status: DC

## 2018-11-26 NOTE — Patient Instructions (Signed)

## 2018-11-26 NOTE — Progress Notes (Signed)
42 y.o. G0P0000 Single  African American Fe here for annual exam. Periods monthly on POP. No issues with use and no bleeding pattern change. Has noted abdomen feels bigger, weight change, and working good food choices. No partner change or STD screening needed. Still smoking but no increase at this time. No other health issues today. Screening labs if needed.  Patient's last menstrual period was 11/19/2018 (exact date).          Sexually active: Yes.    The current method of family planning is oral progesterone-only contraceptive.    Exercising: Yes.    exercise Smoker:  yes  Review of Systems  Constitutional: Negative.   HENT: Negative.   Eyes: Negative.   Respiratory: Negative.   Cardiovascular: Negative.   Gastrointestinal: Negative.   Genitourinary: Negative.   Musculoskeletal: Negative.   Skin: Negative.   Neurological: Negative.   Endo/Heme/Allergies: Negative.   Psychiatric/Behavioral: Negative.     Health Maintenance: Pap:  10-08-16 neg HPV HR neg History of Abnormal Pap: yes MMG:  none Self Breast exams: yes Colonoscopy:  none BMD:   none TDaP:  2016 Shingles: no Pneumonia: no Hep C and HIV: HIV neg 2018 Labs: if needed   reports that she has been smoking. She has never used smokeless tobacco. She reports current alcohol use of about 3.0 - 4.0 standard drinks of alcohol per week. She reports that she does not use drugs.  Past Medical History:  Diagnosis Date  . Abnormal Pap smear of cervix    yrs ago, pt believes she had a procedure done for this  . Anemia     History reviewed. No pertinent surgical history.  Current Outpatient Medications  Medication Sig Dispense Refill  . Multiple Vitamins-Minerals (MULTIVITAMIN PO) Take by mouth as needed.     . norethindrone (MICRONOR,CAMILA,ERRIN) 0.35 MG tablet Take 1 tablet (0.35 mg total) by mouth daily. 3 Package 4   No current facility-administered medications for this visit.     Family History  Problem  Relation Age of Onset  . Diabetes Mother   . Hypertension Mother   . Cancer Maternal Grandfather        prostate  . Cancer Paternal Grandmother        lung    ROS:  Pertinent items are noted in HPI.  Otherwise, a comprehensive ROS was negative.  Exam:   BP 110/78   Pulse 68   Resp 16   Ht 5' 2.75" (1.594 m)   Wt 175 lb (79.4 kg)   LMP 11/19/2018 (Exact Date)   BMI 31.25 kg/m  Height: 5' 2.75" (159.4 cm) Ht Readings from Last 3 Encounters:  11/26/18 5' 2.75" (1.594 m)  01/01/18 5' 2.5" (1.588 m)  10/10/17 5' 2.5" (1.588 m)    General appearance: alert, cooperative and appears stated age Head: Normocephalic, without obvious abnormality, atraumatic Neck: no adenopathy, supple, symmetrical, trachea midline and thyroid normal to inspection and palpation Lungs: clear to auscultation bilaterally Breasts: normal appearance, no masses or tenderness, No nipple retraction or dimpling, No nipple discharge or bleeding, No axillary or supraclavicular adenopathy Heart: regular rate and rhythm Abdomen: soft, non-tender; no masses,  no organomegaly Extremities: extremities normal, atraumatic, no cyanosis or edema Skin: Skin color, texture, turgor normal. No rashes or lesions Lymph nodes: Cervical, supraclavicular, and axillary nodes normal. No abnormal inguinal nodes palpated Neurologic: Grossly normal   Pelvic: External genitalia:  no lesions              Urethra:  normal appearing urethra with no masses, tenderness or lesions              Bartholin's and Skene's: normal                 Vagina: normal appearing vagina with normal color and discharge, no lesions              Cervix: no cervical motion tenderness, no lesions and normal appearance              Pap taken: No. Bimanual Exam:  Uterus:  enlarged, 18-20 week size , history of multiple fibroids weeks size              Adnexa: normal adnexa, no mass, fullness, tenderness and exam limited by fibroid uterus, no large masses  noted               Rectovaginal: Confirms               Anus:  normal sphincter tone, no lesions  Chaperone present: yes  A:  Well Woman with normal exam  Contraception POP working well  Enlarged uterus, history of fibroids with size change  Smoker, not interested in cessation  Mammogram due    P:   Reviewed health and wellness pertinent to exam  Risks/benefits/warning signs and bleeding expectations with POP reviewed.  Rx Micronor see order with instructions  Discussed finding with uterus and had patient palpate her uterus to feel the difference. Questions addressed. Discussed repeat PUS due to size change recommended. Order placed and patient will be called with insurance information and scheduled.  Pap smear: no   counseled on breast self exam, mammography screening, given information to schedule first mammogram and questions addressed., STD prevention, feminine hygiene, adequate intake of calcium and vitamin D, diet and exercise  return annually or prn  An After Visit Summary was printed and given to the patient.

## 2018-11-30 ENCOUNTER — Telehealth: Payer: Self-pay | Admitting: Obstetrics and Gynecology

## 2018-11-30 NOTE — Telephone Encounter (Signed)
Spoke with patient regarding benefit for recommended ultrasound, following appointment with [name], CNM. Patient understood and agreeable. Patient ready to schedule. Patient scheduled 12/24/2018 with Dr Quincy Simmonds. Earlier appointment dates were offered, but patient declined an earlier appointment, stating she will be traveling. Patient aware of appointment date, arrival time and cancellation policy.   Forwarding to Dr Quincy Simmonds for final review. Patient agreeable to disposition. Will close encounter   cc: [name], CNM

## 2018-12-21 ENCOUNTER — Telehealth: Payer: Self-pay | Admitting: Obstetrics and Gynecology

## 2018-12-21 NOTE — Telephone Encounter (Signed)
Patient cancelled her ultrasound appointment for Thursday. See separate staff message. She would like a call to reschedule.

## 2018-12-24 ENCOUNTER — Other Ambulatory Visit: Payer: BLUE CROSS/BLUE SHIELD | Admitting: Obstetrics and Gynecology

## 2018-12-24 ENCOUNTER — Other Ambulatory Visit: Payer: BLUE CROSS/BLUE SHIELD

## 2018-12-24 NOTE — Telephone Encounter (Signed)
Call placed to patient to follow up and rescheduled ultrasound appointment. Left a voicemail message requesting a return call

## 2019-03-04 ENCOUNTER — Telehealth: Payer: Self-pay | Admitting: Physician Assistant

## 2019-03-04 DIAGNOSIS — J069 Acute upper respiratory infection, unspecified: Secondary | ICD-10-CM

## 2019-03-04 MED ORDER — IPRATROPIUM BROMIDE 0.03 % NA SOLN: 2.0000 | Freq: Two times a day (BID) | NASAL | 0 refills | Status: DC

## 2019-03-04 NOTE — Progress Notes (Signed)
We are sorry you are not feeling well.  Here is how we plan to help!  Based on what you have shared with me, it looks like you may have a viral upper respiratory infection.  Upper respiratory infections are caused by a large number of viruses; however, rhinovirus is the most common cause.   Symptoms vary from person to person, with common symptoms including sore throat, cough, and fatigue or lack of energy.  A low-grade fever of up to 100.4 may present, but is often uncommon.  Symptoms vary however, and are closely related to a person's age or underlying illnesses.  The most common symptoms associated with an upper respiratory infection are nasal discharge or congestion, cough, sneezing, headache and pressure in the ears and face.  These symptoms usually persist for about 3 to 10 days, but can last up to 2 weeks.  It is important to know that upper respiratory infections do not cause serious illness or complications in most cases.    Upper respiratory infections can be transmitted from person to person, with the most common method of transmission being a person's hands.  The virus is able to live on the skin and can infect other persons for up to 2 hours after direct contact.  Also, these can be transmitted when someone coughs or sneezes; thus, it is important to cover the mouth to reduce this risk.  To keep the spread of the illness at [place], good hand hygiene is very important.  This is an infection that is most likely caused by a virus. There are no specific treatments other than to help you with the symptoms until the infection runs its course.  We are sorry you are not feeling well.  Here is how we plan to help!   For nasal congestion, you may use an oral decongestants such as Mucinex D or if you have glaucoma or high blood pressure use plain Mucinex.  Saline nasal spray or nasal drops can help and can safely be used as often as needed for congestion.  For your congestion, I have prescribed Ipratropium  Bromide nasal spray 0.03% two sprays in each nostril 2-3 times a day  If you do not have a history of heart disease, hypertension, diabetes or thyroid disease, prostate/bladder issues or glaucoma, you may also use Sudafed to treat nasal congestion.  It is highly recommended that you consult with a pharmacist or your primary care physician to ensure this medication is safe for you to take.     If you have a cough, you may use cough suppressants such as Delsym and Robitussin.  If you have glaucoma or high blood pressure, you can also use Coricidin HBP.    If you have a sore or scratchy throat, use a saltwater gargle-  to  teaspoon of salt dissolved in a 4-ounce to 8-ounce glass of warm water.  Gargle the solution for approximately 15-30 seconds and then spit.  It is important not to swallow the solution.  You can also use throat lozenges/cough drops and Chloraseptic spray to help with throat pain or discomfort.  Warm or cold liquids can also be helpful in relieving throat pain.  For headache, pain or general discomfort, you can use Ibuprofen or Tylenol as directed.   Some authorities believe that zinc sprays or the use of Echinacea may shorten the course of your symptoms.   HOME CARE . Only take medications as instructed by your medical team. . Be sure to drink plenty of  fluids. Water is fine as well as fruit juices, sodas and electrolyte beverages. You may want to stay away from caffeine or alcohol. If you are nauseated, try taking small sips of liquids. How do you know if you are getting enough fluid? Your urine should be a pale yellow or almost colorless. . Get rest. . Taking a steamy shower or using a humidifier may help nasal congestion and ease sore throat pain. You can place a towel over your head and breathe in the steam from hot water coming from a faucet. . Using a saline nasal spray works much the same way. . Cough drops, hard candies and sore throat lozenges may ease your  cough. . Avoid close contacts especially the very young and the elderly . Cover your mouth if you cough or sneeze . Always remember to wash your hands.   GET HELP RIGHT AWAY IF: . You develop worsening fever. . If your symptoms do not improve within 10 days . You develop yellow or green discharge from your nose over 3 days. . You have coughing fits . You develop a severe head ache or visual changes. . You develop shortness of breath, difficulty breathing or start having chest pain . Your symptoms persist after you have completed your treatment plan  MAKE SURE YOU   Understand these instructions.  Will watch your condition.  Will get help right away if you are not doing well or get worse.  Your e-visit answers were reviewed by a board certified advanced clinical practitioner to complete your personal care plan. Depending upon the condition, your plan could have included both over the counter or prescription medications. Please review your pharmacy choice. If there is a problem, you may call our nursing hot line at and have the prescription routed to another pharmacy. Your safety is important to [place]. If you have drug allergies check your prescription carefully.   You can use MyChart to ask questions about today's visit, request a non-urgent call back, or ask for a work or school excuse for 24 hours related to this e-Visit. If it has been greater than 24 hours you will need to follow up with your provider, or enter a new e-Visit to address those concerns. You will get an e-mail in the next two days asking about your experience.  I hope that your e-visit has been valuable and will speed your recovery. Thank you for using e-visits.

## 2019-03-04 NOTE — Progress Notes (Signed)
I have spent 5 minutes in review of e-visit questionnaire, review and updating patient chart, medical decision making and response to patient.   Angela Hanna Angela Fue Cervenka, PA-C    

## 2019-07-14 DIAGNOSIS — Z20828 Contact with and (suspected) exposure to other viral communicable diseases: Secondary | ICD-10-CM | POA: Diagnosis not present

## 2019-08-06 ENCOUNTER — Ambulatory Visit: Payer: BC Managed Care – PPO | Admitting: Podiatry

## 2019-08-06 ENCOUNTER — Other Ambulatory Visit: Payer: Self-pay

## 2019-08-06 ENCOUNTER — Encounter: Payer: Self-pay | Admitting: Podiatry

## 2019-08-06 VITALS — Temp 98.4°F

## 2019-08-06 DIAGNOSIS — M216X9 Other acquired deformities of unspecified foot: Secondary | ICD-10-CM | POA: Diagnosis not present

## 2019-08-06 DIAGNOSIS — M79671 Pain in right foot: Secondary | ICD-10-CM | POA: Diagnosis not present

## 2019-08-06 DIAGNOSIS — Q828 Other specified congenital malformations of skin: Secondary | ICD-10-CM | POA: Diagnosis not present

## 2019-08-06 DIAGNOSIS — L84 Corns and callosities: Secondary | ICD-10-CM | POA: Diagnosis not present

## 2019-08-06 DIAGNOSIS — M79672 Pain in left foot: Secondary | ICD-10-CM

## 2019-08-06 NOTE — Progress Notes (Signed)
This patient present to the office  with chief complaint of callus developing under the  Inside and outside of ball of both feet.  She says this callus has become painful walking and wearing her shoes. Patient has provided self  treatment and has seen Dr.  Cannon Kettle.  She presents to the office for treatment of her painful callus.  Vascular  Dorsalis pedis and posterior tibial pulses are palpable  B/L.  Capillary return  WNL.  Temperature gradient is  WNL.  Skin turgor  WNL  Sensorium  Senn Weinstein monofilament wire  WNL. Normal tactile sensation.  Nail Exam  Patient has normal nails with no evidence of bacterial or fungal infection.  Orthopedic  Exam  Muscle tone and muscle strength  WNL.  No limitations of motion feet  B/L.  No crepitus or joint effusion noted.  Foot type is unremarkable and digits show no abnormalities.  Bony prominences are unremarkable.  Plantar flexed fifth metatarsal  B/L.  Prominent tibial sesamoid  B/L.  Skin  No open lesions.  Normal skin texture and turgor.  Callus/porokeratosis  sub 5th  B/L and sub 1st  B/L.  Porokeratosis secondary plantar flexed fifth metatarsal  B/L and sub tibial sesamoid  B/L.  IE  Debride callus/porokeratosis.  Discussed condition with patient.  Discussed orthoses for dispersion and possible future surgery.  Gardiner Barefoot DPM

## 2019-09-21 DIAGNOSIS — F4323 Adjustment disorder with mixed anxiety and depressed mood: Secondary | ICD-10-CM | POA: Diagnosis not present

## 2019-10-05 DIAGNOSIS — F4323 Adjustment disorder with mixed anxiety and depressed mood: Secondary | ICD-10-CM | POA: Diagnosis not present

## 2019-10-19 DIAGNOSIS — F4323 Adjustment disorder with mixed anxiety and depressed mood: Secondary | ICD-10-CM | POA: Diagnosis not present

## 2019-11-02 DIAGNOSIS — F4323 Adjustment disorder with mixed anxiety and depressed mood: Secondary | ICD-10-CM | POA: Diagnosis not present

## 2019-11-23 DIAGNOSIS — F4323 Adjustment disorder with mixed anxiety and depressed mood: Secondary | ICD-10-CM | POA: Diagnosis not present

## 2019-12-02 ENCOUNTER — Ambulatory Visit: Payer: BLUE CROSS/BLUE SHIELD | Admitting: Certified Nurse Midwife

## 2019-12-07 ENCOUNTER — Ambulatory Visit: Payer: BC Managed Care – PPO | Admitting: Certified Nurse Midwife

## 2019-12-21 DIAGNOSIS — F4323 Adjustment disorder with mixed anxiety and depressed mood: Secondary | ICD-10-CM | POA: Diagnosis not present

## 2020-01-03 ENCOUNTER — Other Ambulatory Visit: Payer: Self-pay

## 2020-01-03 NOTE — Progress Notes (Signed)
44 y.o. G0P0000 Single  African American Fe here for annual exam. Periods normal no issues, except for this last one it started 2 days early and short. UPT today negative. Contraception none. STD screening requested. Sees Urgent care if needed. Patient does not feel her fibroids have changed in size. Continues to have urinary urgency at times. Patient would like to be seen for infertility due to fibroids. No other health issues today.  Patient's last menstrual period was 12/31/2019 (exact date).          Sexually active: Yes.    The current method of family planning is none.    Exercising: Yes.    walking & cardio Smoker:  yes  Review of Systems  Constitutional: Negative.   HENT: Negative.   Eyes: Negative.   Respiratory: Negative.   Cardiovascular: Negative.   Gastrointestinal: Negative.   Genitourinary: Negative.   Musculoskeletal: Negative.   Skin: Negative.   Neurological: Negative.   Endo/Heme/Allergies: Negative.   Psychiatric/Behavioral: Negative.     Health Maintenance: Pap:  10-08-16 neg HPV HR neg History of Abnormal Pap: yes MMG:  none Self Breast exams: yes Colonoscopy:  none BMD:   none TDaP:  2016 Shingles: no Pneumonia: no Hep C and HIV: HIV neg 2018 Labs: upt-neg   reports that she has been smoking. She has never used smokeless tobacco. She reports current alcohol use of about 3.0 - 4.0 standard drinks of alcohol per week. She reports that she does not use drugs.  Past Medical History:  Diagnosis Date  . Abnormal Pap smear of cervix    yrs ago, pt believes she had a procedure done for this  . Anemia     No past surgical history on file.  Current Outpatient Medications  Medication Sig Dispense Refill  . Multiple Vitamins-Minerals (MULTIVITAMIN PO) Take by mouth as needed.      No current facility-administered medications for this visit.    Family History  Problem Relation Age of Onset  . Diabetes Mother   . Hypertension Mother   . Cancer  Maternal Grandfather        prostate  . Cancer Paternal Grandmother        lung    ROS:  Pertinent items are noted in HPI.  Otherwise, a comprehensive ROS was negative.  Exam:   BP 120/80   Pulse 68   Temp (!) 97.2 F (36.2 C) (Skin)   Resp 16   Ht 5' 2.5" (1.588 m)   Wt 177 lb (80.3 kg)   LMP 12/31/2019 (Exact Date)   BMI 31.86 kg/m  Height: 5' 2.5" (158.8 cm) Ht Readings from Last 3 Encounters:  01/04/20 5' 2.5" (1.588 m)  11/26/18 5' 2.75" (1.594 m)  01/01/18 5' 2.5" (1.588 m)    General appearance: alert, cooperative and appears stated age Head: Normocephalic, without obvious abnormality, atraumatic Neck: no adenopathy, supple, symmetrical, trachea midline and thyroid normal to inspection and palpation Lungs: clear to auscultation bilaterally Breasts: normal appearance, no masses or tenderness, No nipple retraction or dimpling, No nipple discharge or bleeding, No axillary or supraclavicular adenopathy Heart: regular rate and rhythm Abdomen: soft, non-tender; no masses,  no organomegaly Extremities: extremities normal, atraumatic, no cyanosis or edema Skin: Skin color, texture, turgor normal. No rashes or lesions Lymph nodes: Cervical, supraclavicular, and axillary nodes normal. No abnormal inguinal nodes palpated Neurologic: Grossly normal   Pelvic: External genitalia:  no lesions  Urethra:  normal appearing urethra with no masses, tenderness or lesions              Bartholin's and Skene's: normal                 Vagina: normal appearing vagina with normal color and discharge, no lesions              Cervix: no cervical motion tenderness, no lesions and nulliparous appearance              Pap taken: Yes.   Bimanual Exam:  Uterus:  enlarged, 20 weeks size              Adnexa: normal adnexa and no mass, fullness, tenderness               Rectovaginal: Confirms               Anus:  normal sphincter tone, no lesions  Chaperone present: yes  A:  Well  Woman with normal exam  Contraception none needed.  History of fibroids no uterine size change  Periods normal  Infertility due fibroids  P:   Reviewed health and wellness pertinent to exam   Discussed no size change in uterus, patient will call if she has urinary symptoms.  Discussed referral to Dr Carmela Rima for evaluation for infertility and possible myomectomy. Patient would like referral. Will wait on pap smear and then refer.  Pap smear: yes   counseled on breast self exam, mammography screening, STD prevention, HIV risk factors and prevention, adequate intake of calcium and vitamin D, diet and exercise  return annually or prn  An After Visit Summary was printed and given to the patient.

## 2020-01-04 ENCOUNTER — Other Ambulatory Visit: Payer: Self-pay

## 2020-01-04 ENCOUNTER — Encounter: Payer: Self-pay | Admitting: Certified Nurse Midwife

## 2020-01-04 ENCOUNTER — Other Ambulatory Visit (HOSPITAL_COMMUNITY)
Admission: RE | Admit: 2020-01-04 | Discharge: 2020-01-04 | Disposition: A | Discharge status: Home / Self Care | Payer: BC Managed Care – PPO | Source: Ambulatory Visit | Attending: Certified Nurse Midwife | Admitting: Certified Nurse Midwife

## 2020-01-04 ENCOUNTER — Ambulatory Visit (INDEPENDENT_AMBULATORY_CARE_PROVIDER_SITE_OTHER): Payer: BC Managed Care – PPO | Admitting: Certified Nurse Midwife

## 2020-01-04 VITALS — BP 120/80 | HR 68 | Temp 97.2°F | Resp 16 | Ht 62.5 in | Wt 177.0 lb

## 2020-01-04 DIAGNOSIS — Z113 Encounter for screening for infections with a predominantly sexual mode of transmission: Secondary | ICD-10-CM | POA: Insufficient documentation

## 2020-01-04 DIAGNOSIS — Z124 Encounter for screening for malignant neoplasm of cervix: Secondary | ICD-10-CM | POA: Diagnosis not present

## 2020-01-04 DIAGNOSIS — Z01419 Encounter for gynecological examination (general) (routine) without abnormal findings: Secondary | ICD-10-CM | POA: Diagnosis not present

## 2020-01-04 DIAGNOSIS — N926 Irregular menstruation, unspecified: Secondary | ICD-10-CM | POA: Diagnosis not present

## 2020-01-04 DIAGNOSIS — Z0000 Encounter for general adult medical examination without abnormal findings: Secondary | ICD-10-CM

## 2020-01-04 LAB — POCT URINE PREGNANCY: Preg Test, Ur: NEGATIVE

## 2020-01-04 NOTE — Patient Instructions (Signed)
EXERCISE AND DIET:  We recommended that you start or continue a regular exercise program for good health. Regular exercise means any activity that makes your heart beat faster and makes you sweat.  We recommend exercising at least 30 minutes per day at least 3 days a week, preferably 4 or 5.  We also recommend a diet low in fat and sugar.  Inactivity, poor dietary choices and obesity can cause diabetes, heart attack, stroke, and kidney damage, among others.    ALCOHOL AND SMOKING:  Women should limit their alcohol intake to no more than 7 drinks/beers/glasses of wine (combined, not each!) per week. Moderation of alcohol intake to this level decreases your risk of breast cancer and liver damage. And of course, no recreational drugs are part of a healthy lifestyle.  And absolutely no smoking or even second hand smoke. Most people know smoking can cause heart and lung diseases, but did you know it also contributes to weakening of your bones? Aging of your skin?  Yellowing of your teeth and nails?  CALCIUM AND VITAMIN D:  Adequate intake of calcium and Vitamin D are recommended.  The recommendations for exact amounts of these supplements seem to change often, but generally speaking 600 mg of calcium (either carbonate or citrate) and 800 units of Vitamin D per day seems prudent. Certain women may benefit from higher intake of Vitamin D.  If you are among these women, your doctor will have told you during your visit.    PAP SMEARS:  Pap smears, to check for cervical cancer or precancers,  have traditionally been done yearly, although recent scientific advances have shown that most women can have pap smears less often.  However, every woman still should have a physical exam from her gynecologist every year. It will include a breast check, inspection of the vulva and vagina to check for abnormal growths or skin changes, a visual exam of the cervix, and then an exam to evaluate the size and shape of the uterus and  ovaries.  And after 42 years of age, a rectal exam is indicated to check for rectal cancers. We will also provide age appropriate advice regarding health maintenance, like when you should have certain vaccines, screening for sexually transmitted diseases, bone density testing, colonoscopy, mammograms, etc.   MAMMOGRAMS:  All women over 40 years old should have a yearly mammogram. Many facilities now offer a "3D" mammogram, which may cost around $50 extra out of pocket. If possible,  we recommend you accept the option to have the 3D mammogram performed.  It both reduces the number of women who will be called back for extra views which then turn out to be normal, and it is better than the routine mammogram at detecting truly abnormal areas.    COLONOSCOPY:  Colonoscopy to screen for colon cancer is recommended for all women at age 50.  We know, you hate the idea of the prep.  We agree, BUT, having colon cancer and not knowing it is worse!!  Colon cancer so often starts as a polyp that can be seen and removed at colonscopy, which can quite literally save your life!  And if your first colonoscopy is normal and you have no family history of colon cancer, most women don't have to have it again for 10 years.  Once every ten years, you can do something that may end up saving your life, right?  We will be happy to help you get it scheduled when you are ready.    Be sure to check your insurance coverage so you understand how much it will cost.  It may be covered as a preventative service at no cost, but you should check your particular policy.      Uterine Fibroids  Uterine fibroids are lumps of tissue (tumors) in your womb (uterus). They are not cancer (are benign). Most women with this condition do not need treatment. Sometimes fibroids can affect your ability to have children (your fertility). If that happens, you may need surgery to take out the fibroids. Follow these instructions at home:  Take  over-the-counter and prescription medicines only as told by your doctor. Your doctor may suggest NSAIDs (such as aspirin or ibuprofen) to help with pain.  Ask your doctor if you should: ? Take iron pills. ? Eat more foods that have iron in them, such as dark green, leafy vegetables.  If directed, apply heat to your back or belly to reduce pain. Use the heat source that your doctor recommends, such as a moist heat pack or a heating pad. ? Put a towel between your skin and the heat source. ? Leave the heat on for 20-30 minutes. ? Remove the heat if your skin turns bright red. This is especially important if you are unable to feel pain, heat, or cold. You may have a greater risk of getting burned.  Pay close attention to your period (menstrual) cycles. Tell your doctor about any changes, such as: ? A heavier blood flow than usual. ? Needing to use more pads or tampons than normal. ? A change in how many days your period lasts. ? A change in symptoms that come with your period, such as cramps or back pain.  Keep all follow-up visits as told by your doctor. This is important. Your doctor may need to watch your fibroids over time for any changes. Contact a doctor if you:  Have pain that does not get better with medicine or heat, such as pain or cramps in: ? Your back. ? The area between your hip bones (pelvic area). ? Your belly.  Have new bleeding between your periods.  Have more bleeding during or between your periods.  Feel very tired or weak.  Feel light-headed. Get help right away if you:  Pass out (faint).  Have pain in the area between your hip bones that suddenly gets worse.  Have bleeding that soaks a tampon or pad in 30 minutes or less. Summary  Uterine fibroids are lumps of tissue (tumors) in your womb (uterus). They are not cancer.  The only treatment that most women need is taking aspirin or ibuprofen for pain.  Contact a doctor if you have pain or cramps that do  not get better with medicine.  Make sure you know what symptoms you should get help for right away. This information is not intended to replace advice given to you by your health care provider. Make sure you discuss any questions you have with your health care provider. Document Revised: 11/14/2017 Document Reviewed: 10/28/2017 Elsevier Patient Education  2020 [employer].

## 2020-01-04 NOTE — Addendum Note (Signed)
Addended by: Regina Eck on: 01/04/2020 04:36 PM   Modules accepted: Orders

## 2020-01-05 ENCOUNTER — Other Ambulatory Visit: Payer: Self-pay

## 2020-01-05 LAB — COMPREHENSIVE METABOLIC PANEL
ALT: 14 IU/L (ref 0–32)
AST: 24 IU/L (ref 0–40)
Albumin/Globulin Ratio: 2.1 (ref 1.2–2.2)
Albumin: 4 g/dL (ref 3.8–4.8)
Alkaline Phosphatase: 45 IU/L (ref 39–117)
BUN/Creatinine Ratio: 14 (ref 9–23)
BUN: 11 mg/dL (ref 6–24)
Bilirubin Total: 0.4 mg/dL (ref 0.0–1.2)
CO2: 22 mmol/L (ref 20–29)
Calcium: 8.8 mg/dL (ref 8.7–10.2)
Chloride: 107 mmol/L — ABNORMAL HIGH (ref 96–106)
Creatinine, Ser: 0.8 mg/dL (ref 0.57–1.00)
GFR calc Af Amer: 106 mL/min/{1.73_m2} (ref >59)
GFR calc non Af Amer: 92 mL/min/{1.73_m2} (ref >59)
Globulin, Total: 1.9 g/dL (ref 1.5–4.5)
Glucose: 77 mg/dL (ref 65–99)
Potassium: 4.4 mmol/L (ref 3.5–5.2)
Sodium: 142 mmol/L (ref 134–144)
Total Protein: 5.9 g/dL — ABNORMAL LOW (ref 6.0–8.5)

## 2020-01-05 LAB — VAGINITIS/VAGINOSIS, DNA PROBE
Candida Species: NEGATIVE
Gardnerella vaginalis: POSITIVE — AB
Trichomonas vaginosis: NEGATIVE

## 2020-01-05 LAB — HIV ANTIBODY (ROUTINE TESTING W REFLEX): HIV Screen 4th Generation wRfx: NONREACTIVE

## 2020-01-05 LAB — RPR: RPR Ser Ql: NONREACTIVE

## 2020-01-05 LAB — LIPID PANEL
Chol/HDL Ratio: 1.9 ratio (ref 0.0–4.4)
Cholesterol, Total: 155 mg/dL (ref 100–199)
HDL: 83 mg/dL (ref >39)
LDL Chol Calc (NIH): 58 mg/dL (ref 0–99)
Triglycerides: 70 mg/dL (ref 0–149)
VLDL Cholesterol Cal: 14 mg/dL (ref 5–40)

## 2020-01-05 LAB — CBC
Hematocrit: 40.7 % (ref 34.0–46.6)
Hemoglobin: 13.2 g/dL (ref 11.1–15.9)
MCH: 31 pg (ref 26.6–33.0)
MCHC: 32.4 g/dL (ref 31.5–35.7)
MCV: 96 fL (ref 79–97)
Platelets: 231 10*3/uL (ref 150–450)
RBC: 4.26 x10E6/uL (ref 3.77–5.28)
RDW: 11.8 % (ref 11.7–15.4)
WBC: 6.7 10*3/uL (ref 3.4–10.8)

## 2020-01-05 LAB — TSH: TSH: 3.57 u[IU]/mL (ref 0.450–4.500)

## 2020-01-05 LAB — HEPATITIS C ANTIBODY: Hep C Virus Ab: 0.1 s/co ratio (ref 0.0–0.9)

## 2020-01-05 MED ORDER — METRONIDAZOLE 500 MG PO TABS: 500.0000 mg | ORAL_TABLET | Freq: Two times a day (BID) | ORAL | 0 refills | Status: DC

## 2020-01-06 LAB — CYTOLOGY - PAP
Adequacy: ABSENT
Chlamydia: NEGATIVE
Comment: NEGATIVE
Comment: NORMAL
Diagnosis: NEGATIVE
Neisseria Gonorrhea: NEGATIVE

## 2020-01-07 ENCOUNTER — Telehealth: Payer: Self-pay | Admitting: *Deleted

## 2020-01-07 DIAGNOSIS — N852 Hypertrophy of uterus: Secondary | ICD-10-CM

## 2020-01-07 DIAGNOSIS — Z86018 Personal history of other benign neoplasm: Secondary | ICD-10-CM

## 2020-01-07 NOTE — Telephone Encounter (Signed)
Ambulatory referral placed to Dr. Kerin Perna.   Routing to [employer] .

## 2020-01-07 NOTE — Telephone Encounter (Signed)
-----   Message from Regina Eck, CNM sent at 01/07/2020 10:02 AM EST ----- Patient requests referral to [facility] to discuss infertility with fibroid history. Discussed with patient at last visit. Pap smear and exam normal, except enlarged uterus with fibroids.

## 2020-01-20 NOTE — Telephone Encounter (Signed)
Per review of Epic, patient is scheduled for 04/06/20.   Routing to [employer], CNM FYI.   Encounter closed.   Cc: Magdalene Patricia

## 2020-01-20 NOTE — Telephone Encounter (Signed)
Thank you :)

## 2020-02-22 DIAGNOSIS — Z3161 Procreative counseling and advice using natural family planning: Secondary | ICD-10-CM | POA: Diagnosis not present

## 2020-02-22 DIAGNOSIS — D251 Intramural leiomyoma of uterus: Secondary | ICD-10-CM | POA: Diagnosis not present

## 2020-02-22 DIAGNOSIS — R102 Pelvic and perineal pain: Secondary | ICD-10-CM | POA: Diagnosis not present

## 2020-02-22 DIAGNOSIS — E288 Other ovarian dysfunction: Secondary | ICD-10-CM | POA: Diagnosis not present

## 2020-02-22 DIAGNOSIS — Z319 Encounter for procreative management, unspecified: Secondary | ICD-10-CM | POA: Diagnosis not present

## 2020-02-22 DIAGNOSIS — D252 Subserosal leiomyoma of uterus: Secondary | ICD-10-CM | POA: Diagnosis not present

## 2020-02-22 DIAGNOSIS — Z3141 Encounter for fertility testing: Secondary | ICD-10-CM | POA: Diagnosis not present

## 2020-02-22 DIAGNOSIS — Z1329 Encounter for screening for other suspected endocrine disorder: Secondary | ICD-10-CM | POA: Diagnosis not present

## 2020-03-03 ENCOUNTER — Encounter: Payer: Self-pay | Admitting: Certified Nurse Midwife

## 2020-03-10 ENCOUNTER — Other Ambulatory Visit: Payer: Self-pay | Admitting: Obstetrics and Gynecology

## 2020-03-10 ENCOUNTER — Other Ambulatory Visit: Payer: Self-pay | Admitting: Emergency Medicine

## 2020-03-10 DIAGNOSIS — D251 Intramural leiomyoma of uterus: Secondary | ICD-10-CM

## 2020-03-10 DIAGNOSIS — D252 Subserosal leiomyoma of uterus: Secondary | ICD-10-CM

## 2020-03-28 DIAGNOSIS — F4323 Adjustment disorder with mixed anxiety and depressed mood: Secondary | ICD-10-CM | POA: Diagnosis not present

## 2020-03-28 DIAGNOSIS — F331 Major depressive disorder, recurrent, moderate: Secondary | ICD-10-CM | POA: Diagnosis not present

## 2020-04-07 ENCOUNTER — Ambulatory Visit
Admission: RE | Admit: 2020-04-07 | Discharge: 2020-04-07 | Disposition: A | Discharge status: Home / Self Care | Payer: BC Managed Care – PPO | Source: Ambulatory Visit | Attending: Obstetrics and Gynecology | Admitting: Obstetrics and Gynecology

## 2020-04-07 ENCOUNTER — Other Ambulatory Visit: Payer: Self-pay

## 2020-04-07 DIAGNOSIS — D252 Subserosal leiomyoma of uterus: Secondary | ICD-10-CM

## 2020-04-07 DIAGNOSIS — D259 Leiomyoma of uterus, unspecified: Secondary | ICD-10-CM | POA: Diagnosis not present

## 2020-04-07 DIAGNOSIS — D251 Intramural leiomyoma of uterus: Secondary | ICD-10-CM

## 2020-04-07 MED ORDER — GADOBENATE DIMEGLUMINE 529 MG/ML IV SOLN
15.0000 mL | Freq: Once | INTRAVENOUS | Status: AC | PRN
  Administered 2020-04-07: 15 mL via INTRAVENOUS

## 2020-04-18 DIAGNOSIS — F331 Major depressive disorder, recurrent, moderate: Secondary | ICD-10-CM | POA: Diagnosis not present

## 2020-04-20 DIAGNOSIS — Z3161 Procreative counseling and advice using natural family planning: Secondary | ICD-10-CM | POA: Diagnosis not present

## 2020-04-20 DIAGNOSIS — D252 Subserosal leiomyoma of uterus: Secondary | ICD-10-CM | POA: Diagnosis not present

## 2020-04-20 DIAGNOSIS — D251 Intramural leiomyoma of uterus: Secondary | ICD-10-CM | POA: Diagnosis not present

## 2020-04-20 DIAGNOSIS — E288 Other ovarian dysfunction: Secondary | ICD-10-CM | POA: Diagnosis not present

## 2020-09-08 DIAGNOSIS — R030 Elevated blood-pressure reading, without diagnosis of hypertension: Secondary | ICD-10-CM | POA: Diagnosis not present

## 2020-09-08 DIAGNOSIS — R519 Headache, unspecified: Secondary | ICD-10-CM | POA: Diagnosis not present

## 2020-09-08 DIAGNOSIS — F17218 Nicotine dependence, cigarettes, with other nicotine-induced disorders: Secondary | ICD-10-CM | POA: Diagnosis not present

## 2020-09-08 DIAGNOSIS — Z13220 Encounter for screening for lipoid disorders: Secondary | ICD-10-CM | POA: Diagnosis not present

## 2020-09-26 DIAGNOSIS — F331 Major depressive disorder, recurrent, moderate: Secondary | ICD-10-CM | POA: Diagnosis not present

## 2020-12-01 DIAGNOSIS — R030 Elevated blood-pressure reading, without diagnosis of hypertension: Secondary | ICD-10-CM | POA: Diagnosis not present

## 2020-12-01 DIAGNOSIS — F17218 Nicotine dependence, cigarettes, with other nicotine-induced disorders: Secondary | ICD-10-CM | POA: Diagnosis not present

## 2021-01-05 ENCOUNTER — Ambulatory Visit: Payer: BC Managed Care – PPO | Admitting: Certified Nurse Midwife

## 2021-06-21 ENCOUNTER — Other Ambulatory Visit: Payer: Self-pay

## 2021-06-21 ENCOUNTER — Ambulatory Visit
Admission: RE | Admit: 2021-06-21 | Discharge: 2021-06-21 | Disposition: A | Discharge status: Home / Self Care | Payer: BC Managed Care – PPO | Source: Ambulatory Visit | Attending: Physician Assistant | Admitting: Physician Assistant

## 2021-06-21 VITALS — BP 135/88 | HR 78 | Temp 98.7°F | Resp 18

## 2021-06-21 DIAGNOSIS — J329 Chronic sinusitis, unspecified: Secondary | ICD-10-CM

## 2021-06-21 DIAGNOSIS — R059 Cough, unspecified: Secondary | ICD-10-CM | POA: Diagnosis not present

## 2021-06-21 DIAGNOSIS — J40 Bronchitis, not specified as acute or chronic: Secondary | ICD-10-CM | POA: Diagnosis not present

## 2021-06-21 MED ORDER — AMOXICILLIN-POT CLAVULANATE 875-125 MG PO TABS
1.0000 | ORAL_TABLET | Freq: Two times a day (BID) | ORAL | 0 refills | Status: DC
Start: 2021-06-21 — End: 2021-10-13

## 2021-06-21 MED ORDER — PROMETHAZINE-DM 6.25-15 MG/5ML PO SYRP
5.0000 mL | ORAL_SOLUTION | Freq: Every evening | ORAL | 0 refills | Status: DC | PRN
Start: 2021-06-21 — End: 2022-08-08

## 2021-06-21 MED ORDER — PREDNISONE 10 MG PO TABS
20.0000 mg | ORAL_TABLET | Freq: Every day | ORAL | 0 refills | Status: AC
Start: 2021-06-21 — End: 2021-06-25

## 2021-06-21 NOTE — Discharge Instructions (Addendum)
Start Augmentin twice daily for 7 days to cover for sinus infection.  Take prednisone burst (20 mg for 4 days) to help with inflammation and coughing.  You should not take NSAIDs including aspirin, ibuprofen/Advil, naproxen/Aleve with this medication due to risk of GI bleeding.  You can take Promethazine DM at night to help with cough but this will make you sleepy do not drive drink alcohol with it.  I recommend you use Mucinex and Flonase for additional symptom relief.  If your symptoms do not significantly improve with this medication regimen you need to be reevaluated.  If you have any worsening symptoms including chest pain or shortness of breath you need to go to the emergency room.

## 2021-06-21 NOTE — ED Provider Notes (Signed)
[name]    CSN: [id number] Arrival date & time: 06/21/21  9528      History   Chief Complaint Chief Complaint  Patient presents with   Cough    HPI Angela Hanna is a 45 y.o. female.   Patient presents today with a 1 month history of productive cough.  She reports sputum is thick and colored.  She is having difficulty with daily activities as result of symptoms including sleeping through night.  She has tried several over-the-counter medications for symptom management without improvement of symptoms.  She denies history of asthma or COPD.  She is a current everyday smoker.  She is up-to-date on COVID-19 vaccine but has not had influenza vaccination.  Denies any recent antibiotic use.  She is having some posttussive emesis particularly at night.  Denies any known sick contacts.  She is having difficulty with daily activities as result of symptoms.   Past Medical History:  Diagnosis Date   Abnormal Pap smear of cervix    yrs ago, pt believes she had a procedure done for this   Anemia     Patient Active Problem List   Diagnosis Date Noted   Fibroids 09/22/2014    History reviewed. No pertinent surgical history.  OB History     Gravida  0   Para  0   Term  0   Preterm  0   AB  0   Living  0      SAB  0   IAB  0   Ectopic  0   Multiple  0   Live Births               Home Medications    Prior to Admission medications   Medication Sig Start Date End Date Taking? Authorizing Provider  amoxicillin-clavulanate (AUGMENTIN) 875-125 MG tablet Take 1 tablet by mouth every 12 (twelve) hours. 06/21/21  Yes Danthony Kendrix K, PA-C  predniSONE (DELTASONE) 10 MG tablet Take 2 tablets (20 mg total) by mouth daily for 4 days. 06/21/21 06/25/21 Yes Cristian Grieves K, PA-C  promethazine-dextromethorphan (PROMETHAZINE-DM) 6.25-15 MG/5ML syrup Take 5 mLs by mouth at bedtime as needed for cough. 06/21/21  Yes Heyli Min K, PA-C  Multiple Vitamins-Minerals  (MULTIVITAMIN PO) Take by mouth as needed.     [provider]    Family History Family History  Problem Relation Age of Onset   Diabetes Mother    Hypertension Mother    Cancer Maternal Grandfather        prostate   Cancer Paternal Grandmother        lung    Social History Social History   Tobacco Use   Smoking status: Every Day    Years: 20.00    Pack years: 0.00    Types: Cigarettes   Smokeless tobacco: Never   Tobacco comments:    2 packs per week  Vaping Use   Vaping Use: Never used  Substance Use Topics   Alcohol use: Yes    Alcohol/week: 3.0 - 4.0 standard drinks    Types: 3 - 4 Standard drinks or equivalent per week   Drug use: No     Allergies   Patient has no known allergies.   Review of Systems Review of Systems  Constitutional:  Positive for activity change and fatigue. Negative for appetite change and fever.  HENT:  Positive for congestion, sinus pressure and sore throat. Negative for sneezing.   Respiratory:  Positive for cough.  Negative for shortness of breath.   Cardiovascular:  Negative for chest pain.  Gastrointestinal:  Negative for abdominal pain, diarrhea, nausea and vomiting.  Musculoskeletal:  Negative for arthralgias and myalgias.  Neurological:  Negative for dizziness, light-headedness and headaches.    Physical Exam Triage Vital Signs ED Triage Vitals  Enc Vitals Group     BP 06/21/21 0847 135/88     Pulse Rate 06/21/21 0847 78     Resp 06/21/21 0847 18     Temp 06/21/21 0847 98.7 F (37.1 C)     Temp Source 06/21/21 0847 Oral     SpO2 06/21/21 0847 98 %     Weight --      Height --      Head Circumference --      Peak Flow --      Pain Score 06/21/21 0850 0     Pain Loc --      Pain Edu? --      Excl. in [place]? --    No data found.  Updated Vital Signs BP 135/88 (BP Location: Left Arm)   Pulse 78   Temp 98.7 F (37.1 C) (Oral)   Resp 18   SpO2 98%   Visual Acuity Right Eye Distance:   Left Eye  Distance:   Bilateral Distance:    Right Eye Near:   Left Eye Near:    Bilateral Near:     Physical Exam Vitals reviewed.  Constitutional:      General: She is awake. She is not in acute distress.    Appearance: Normal appearance. She is normal weight. She is not ill-appearing.     Comments: Very pleasant female appears stated age in no acute distress  HENT:     Head: Normocephalic and atraumatic.     Right Ear: Tympanic membrane, ear canal and external ear normal. Tympanic membrane is not erythematous or bulging.     Left Ear: Tympanic membrane, ear canal and external ear normal. Tympanic membrane is not erythematous or bulging.     Nose:     Right Sinus: Maxillary sinus tenderness present. No frontal sinus tenderness.     Left Sinus: Maxillary sinus tenderness present. No frontal sinus tenderness.     Mouth/Throat:     Pharynx: Uvula midline. Posterior oropharyngeal erythema present. No oropharyngeal exudate.     Comments: Moderate drainage in posterior pharynx Cardiovascular:     Rate and Rhythm: Normal rate and regular rhythm.     Heart sounds: Normal heart sounds, S1 normal and S2 normal. No murmur heard. Pulmonary:     Effort: Pulmonary effort is normal.     Breath sounds: Normal breath sounds. No wheezing, rhonchi or rales.     Comments: Clear to auscultation bilaterally Lymphadenopathy:     Head:     Right side of head: No submental, submandibular or tonsillar adenopathy.     Left side of head: No submental, submandibular or tonsillar adenopathy.     Cervical: No cervical adenopathy.  Psychiatric:        Behavior: Behavior is cooperative.     UC Treatments / Results  Labs (all labs ordered are listed, but only abnormal results are displayed) Labs Reviewed - No data to display  EKG   Radiology No results found.  Procedures Procedures (including critical care time)  Medications Ordered in UC Medications - No data to display  Initial Impression /  Assessment and Plan / UC Course  I have reviewed the triage vital  signs and the nursing notes.  Pertinent labs & imaging results that were available during my care of the patient were reviewed by me and considered in my medical decision making (see chart for details).      Vital signs and physical exam reassuring today; no indication for emergent evaluation or imaging.  No indication for viral testing given patient has been symptomatic for a month.  She was started on Augmentin given prolonged and worsening symptoms.  She was given prednisone 20 mg daily with instruction not to take NSAIDs.  She was prescribed Promethazine DM to be used at night with instruction not to drive or drink alcohol this medication as drowsiness is a common side effect.  Recommended she use plenty of over-the-counter medications including Mucinex and Flonase for symptom relief.  Discussed alarm symptoms that warrant emergent evaluation.  Strict return precautions given to which patient expressed understanding.  Final Clinical Impressions(s) / UC Diagnoses   Final diagnoses:  Sinobronchitis  Cough     Discharge Instructions      Start Augmentin twice daily for 7 days to cover for sinus infection.  Take prednisone burst (20 mg for 4 days) to help with inflammation and coughing.  You should not take NSAIDs including aspirin, ibuprofen/Advil, naproxen/Aleve with this medication due to risk of GI bleeding.  You can take Promethazine DM at night to help with cough but this will make you sleepy do not drive drink alcohol with it.  I recommend you use Mucinex and Flonase for additional symptom relief.  If your symptoms do not significantly improve with this medication regimen you need to be reevaluated.  If you have any worsening symptoms including chest pain or shortness of breath you need to go to the emergency room.     ED Prescriptions     Medication Sig Dispense Auth. Provider   amoxicillin-clavulanate (AUGMENTIN)  875-125 MG tablet Take 1 tablet by mouth every 12 (twelve) hours. 14 tablet Maddax Palinkas K, PA-C   predniSONE (DELTASONE) 10 MG tablet Take 2 tablets (20 mg total) by mouth daily for 4 days. 8 tablet Deandre Brannan K, PA-C   promethazine-dextromethorphan (PROMETHAZINE-DM) 6.25-15 MG/5ML syrup Take 5 mLs by mouth at bedtime as needed for cough. 118 mL Rice Walsh K, PA-C      PDMP not reviewed this encounter.   Terrilee Croak, PA-C 06/21/21 4403

## 2021-06-21 NOTE — ED Triage Notes (Signed)
One month h/o cough and intermittent sore throat. Cough is interfering with her sleep. Notes a few episodes of post-tussive emesis and decreased appetite. Has been taking OTC cough meds with some relief. Denies d/r. No pain with swallowing.

## 2021-10-13 ENCOUNTER — Ambulatory Visit
Admission: RE | Admit: 2021-10-13 | Discharge: 2021-10-13 | Disposition: A | Discharge status: Home / Self Care | Payer: BC Managed Care – PPO | Source: Ambulatory Visit | Attending: Physician Assistant | Admitting: Physician Assistant

## 2021-10-13 ENCOUNTER — Other Ambulatory Visit: Payer: Self-pay

## 2021-10-13 VITALS — BP 153/99 | HR 71 | Temp 98.3°F | Resp 16

## 2021-10-13 DIAGNOSIS — K047 Periapical abscess without sinus: Secondary | ICD-10-CM | POA: Diagnosis not present

## 2021-10-13 MED ORDER — AMOXICILLIN 500 MG PO CAPS
500.0000 mg | ORAL_CAPSULE | Freq: Three times a day (TID) | ORAL | 0 refills | Status: DC
Start: 2021-10-13 — End: 2022-08-08

## 2021-10-13 NOTE — ED Triage Notes (Signed)
Pt present left side top area tooth pain. Symptoms start over a week ago.

## 2021-10-13 NOTE — ED Provider Notes (Signed)
[name]    CSN: [id number] Arrival date & time: 10/13/21  1051      History   Chief Complaint Chief Complaint  Patient presents with   Dental Pain   Appointment    1100    HPI Angela Hanna is a 45 y.o. female.   Patient here today for evaluation of dental pain to her upper left back molar.  She reports that she thinks it is due to wisdom teeth and feels she will likely need to have her wisdom tooth removed on that side.  She has just been unable to afford this procedure.  She has not had fever or chills.  She has tried over-the-counter treatment without significant relief.  The history is provided by the patient.   Past Medical History:  Diagnosis Date   Abnormal Pap smear of cervix    yrs ago, pt believes she had a procedure done for this   Anemia     Patient Active Problem List   Diagnosis Date Noted   Fibroids 09/22/2014    History reviewed. No pertinent surgical history.  OB History     Gravida  0   Para  0   Term  0   Preterm  0   AB  0   Living  0      SAB  0   IAB  0   Ectopic  0   Multiple  0   Live Births               Home Medications    Prior to Admission medications   Medication Sig Start Date End Date Taking? Authorizing Provider  amoxicillin (AMOXIL) 500 MG capsule Take 1 capsule (500 mg total) by mouth 3 (three) times daily. 10/13/21  Yes Francene Finders, PA-C  Multiple Vitamins-Minerals (MULTIVITAMIN PO) Take by mouth as needed.     [provider]  promethazine-dextromethorphan (PROMETHAZINE-DM) 6.25-15 MG/5ML syrup Take 5 mLs by mouth at bedtime as needed for cough. 06/21/21   Raspet, Derry Skill, PA-C    Family History Family History  Problem Relation Age of Onset   Diabetes Mother    Hypertension Mother    Cancer Maternal Grandfather        prostate   Cancer Paternal Grandmother        lung    Social History Social History   Tobacco Use   Smoking status: Every Day    Years:  20.00    Types: Cigarettes   Smokeless tobacco: Never   Tobacco comments:    2 packs per week  Vaping Use   Vaping Use: Never used  Substance Use Topics   Alcohol use: Yes    Alcohol/week: 3.0 - 4.0 standard drinks    Types: 3 - 4 Standard drinks or equivalent per week   Drug use: No     Allergies   Patient has no known allergies.   Review of Systems Review of Systems  Constitutional:  Negative for chills and fever.  HENT:  Positive for dental problem.   Eyes:  Negative for discharge and redness.  Respiratory:  Negative for shortness of breath.   Gastrointestinal:  Negative for abdominal pain, nausea and vomiting.    Physical Exam Triage Vital Signs ED Triage Vitals  Enc Vitals Group     BP      Pulse      Resp      Temp      Temp src  SpO2      Weight      Height      Head Circumference      Peak Flow      Pain Score      Pain Loc      Pain Edu?      Excl. in [place]?    No data found.  Updated Vital Signs BP (!) 153/99 (BP Location: Right Arm)   Pulse 71   Temp 98.3 F (36.8 C) (Oral)   Resp 16   LMP 10/04/2021   SpO2 98%      Physical Exam Vitals and nursing note reviewed.  Constitutional:      General: She is not in acute distress.    Appearance: Normal appearance. She is not ill-appearing.  HENT:     Head: Normocephalic and atraumatic.     Mouth/Throat:      Comments: Significant erythema and swelling to inner gingiva of left back molar.  Eyes:     Conjunctiva/sclera: Conjunctivae normal.  Cardiovascular:     Rate and Rhythm: Normal rate.  Pulmonary:     Effort: Pulmonary effort is normal.  Neurological:     Mental Status: She is alert.  Psychiatric:        Mood and Affect: Mood normal.        Behavior: Behavior normal.        Thought Content: Thought content normal.     UC Treatments / Results  Labs (all labs ordered are listed, but only abnormal results are displayed) Labs Reviewed - No data to  display  EKG   Radiology No results found.  Procedures Procedures (including critical care time)  Medications Ordered in UC Medications - No data to display  Initial Impression / Assessment and Plan / UC Course  I have reviewed the triage vital signs and the nursing notes.  Pertinent labs & imaging results that were available during my care of the patient were reviewed by me and considered in my medical decision making (see chart for details).  Suspect impacted wisdom tooth and will treat to cover abscess with antibiotics.  Recommended follow-up with dentist as soon as possible.  Patient does report she does have a upcoming appointment next month.  Encouraged sooner follow-up with any further concerns  Final Clinical Impressions(s) / UC Diagnoses   Final diagnoses:  Abscessed tooth   Discharge Instructions   None    ED Prescriptions     Medication Sig Dispense Auth. Provider   amoxicillin (AMOXIL) 500 MG capsule Take 1 capsule (500 mg total) by mouth 3 (three) times daily. 21 capsule Francene Finders, PA-C      PDMP not reviewed this encounter.   Francene Finders, PA-C 10/13/21 1125

## 2021-11-21 ENCOUNTER — Other Ambulatory Visit: Payer: Self-pay | Admitting: Obstetrics and Gynecology

## 2021-11-21 DIAGNOSIS — N6325 Unspecified lump in the left breast, overlapping quadrants: Secondary | ICD-10-CM | POA: Diagnosis not present

## 2021-11-21 DIAGNOSIS — D219 Benign neoplasm of connective and other soft tissue, unspecified: Secondary | ICD-10-CM | POA: Diagnosis not present

## 2021-11-21 DIAGNOSIS — Z01419 Encounter for gynecological examination (general) (routine) without abnormal findings: Secondary | ICD-10-CM | POA: Diagnosis not present

## 2021-11-21 DIAGNOSIS — N921 Excessive and frequent menstruation with irregular cycle: Secondary | ICD-10-CM | POA: Diagnosis not present

## 2021-12-20 DIAGNOSIS — N921 Excessive and frequent menstruation with irregular cycle: Secondary | ICD-10-CM | POA: Diagnosis not present

## 2021-12-20 DIAGNOSIS — D219 Benign neoplasm of connective and other soft tissue, unspecified: Secondary | ICD-10-CM | POA: Diagnosis not present

## 2021-12-28 ENCOUNTER — Other Ambulatory Visit: Payer: Self-pay

## 2021-12-28 ENCOUNTER — Ambulatory Visit
Admission: RE | Admit: 2021-12-28 | Discharge: 2021-12-28 | Disposition: A | Discharge status: Home / Self Care | Payer: BC Managed Care – PPO | Source: Ambulatory Visit | Attending: Obstetrics and Gynecology | Admitting: Obstetrics and Gynecology

## 2021-12-28 DIAGNOSIS — N6325 Unspecified lump in the left breast, overlapping quadrants: Secondary | ICD-10-CM

## 2021-12-28 DIAGNOSIS — R922 Inconclusive mammogram: Secondary | ICD-10-CM | POA: Diagnosis not present

## 2022-01-15 DIAGNOSIS — F17210 Nicotine dependence, cigarettes, uncomplicated: Secondary | ICD-10-CM | POA: Diagnosis not present

## 2022-01-15 DIAGNOSIS — R0602 Shortness of breath: Secondary | ICD-10-CM | POA: Diagnosis not present

## 2022-01-15 DIAGNOSIS — D539 Nutritional anemia, unspecified: Secondary | ICD-10-CM | POA: Diagnosis not present

## 2022-01-15 DIAGNOSIS — E7800 Pure hypercholesterolemia, unspecified: Secondary | ICD-10-CM | POA: Diagnosis not present

## 2022-01-15 DIAGNOSIS — R5383 Other fatigue: Secondary | ICD-10-CM | POA: Diagnosis not present

## 2022-01-15 DIAGNOSIS — Z1331 Encounter for screening for depression: Secondary | ICD-10-CM | POA: Diagnosis not present

## 2022-01-15 DIAGNOSIS — R635 Abnormal weight gain: Secondary | ICD-10-CM | POA: Diagnosis not present

## 2022-01-15 DIAGNOSIS — E8881 Metabolic syndrome: Secondary | ICD-10-CM | POA: Diagnosis not present

## 2022-01-15 DIAGNOSIS — N914 Secondary oligomenorrhea: Secondary | ICD-10-CM | POA: Diagnosis not present

## 2022-01-15 DIAGNOSIS — Z1159 Encounter for screening for other viral diseases: Secondary | ICD-10-CM | POA: Diagnosis not present

## 2022-01-15 DIAGNOSIS — E349 Endocrine disorder, unspecified: Secondary | ICD-10-CM | POA: Diagnosis not present

## 2022-01-15 DIAGNOSIS — E559 Vitamin D deficiency, unspecified: Secondary | ICD-10-CM | POA: Diagnosis not present

## 2022-01-15 DIAGNOSIS — E663 Overweight: Secondary | ICD-10-CM | POA: Diagnosis not present

## 2022-01-15 DIAGNOSIS — Z131 Encounter for screening for diabetes mellitus: Secondary | ICD-10-CM | POA: Diagnosis not present

## 2022-01-15 DIAGNOSIS — Z79899 Other long term (current) drug therapy: Secondary | ICD-10-CM | POA: Diagnosis not present

## 2022-02-16 DIAGNOSIS — E611 Iron deficiency: Secondary | ICD-10-CM | POA: Diagnosis not present

## 2022-02-16 DIAGNOSIS — R635 Abnormal weight gain: Secondary | ICD-10-CM | POA: Diagnosis not present

## 2022-02-16 DIAGNOSIS — E559 Vitamin D deficiency, unspecified: Secondary | ICD-10-CM | POA: Diagnosis not present

## 2022-02-16 DIAGNOSIS — N914 Secondary oligomenorrhea: Secondary | ICD-10-CM | POA: Diagnosis not present

## 2022-02-16 DIAGNOSIS — E663 Overweight: Secondary | ICD-10-CM | POA: Diagnosis not present

## 2022-02-16 DIAGNOSIS — F17210 Nicotine dependence, cigarettes, uncomplicated: Secondary | ICD-10-CM | POA: Diagnosis not present

## 2022-02-19 DIAGNOSIS — Z111 Encounter for screening for respiratory tuberculosis: Secondary | ICD-10-CM | POA: Diagnosis not present

## 2022-02-19 DIAGNOSIS — Z23 Encounter for immunization: Secondary | ICD-10-CM | POA: Diagnosis not present

## 2022-02-21 DIAGNOSIS — Z111 Encounter for screening for respiratory tuberculosis: Secondary | ICD-10-CM | POA: Diagnosis not present

## 2022-03-19 DIAGNOSIS — F17210 Nicotine dependence, cigarettes, uncomplicated: Secondary | ICD-10-CM | POA: Diagnosis not present

## 2022-03-19 DIAGNOSIS — R635 Abnormal weight gain: Secondary | ICD-10-CM | POA: Diagnosis not present

## 2022-03-19 DIAGNOSIS — E559 Vitamin D deficiency, unspecified: Secondary | ICD-10-CM | POA: Diagnosis not present

## 2022-03-19 DIAGNOSIS — E663 Overweight: Secondary | ICD-10-CM | POA: Diagnosis not present

## 2022-03-19 DIAGNOSIS — E611 Iron deficiency: Secondary | ICD-10-CM | POA: Diagnosis not present

## 2022-03-19 DIAGNOSIS — N914 Secondary oligomenorrhea: Secondary | ICD-10-CM | POA: Diagnosis not present

## 2022-04-08 DIAGNOSIS — D251 Intramural leiomyoma of uterus: Secondary | ICD-10-CM | POA: Diagnosis not present

## 2022-04-19 DIAGNOSIS — N914 Secondary oligomenorrhea: Secondary | ICD-10-CM | POA: Diagnosis not present

## 2022-04-19 DIAGNOSIS — E559 Vitamin D deficiency, unspecified: Secondary | ICD-10-CM | POA: Diagnosis not present

## 2022-04-19 DIAGNOSIS — E611 Iron deficiency: Secondary | ICD-10-CM | POA: Diagnosis not present

## 2022-04-19 DIAGNOSIS — E663 Overweight: Secondary | ICD-10-CM | POA: Diagnosis not present

## 2022-04-19 DIAGNOSIS — F17210 Nicotine dependence, cigarettes, uncomplicated: Secondary | ICD-10-CM | POA: Diagnosis not present

## 2022-04-19 DIAGNOSIS — R635 Abnormal weight gain: Secondary | ICD-10-CM | POA: Diagnosis not present

## 2022-05-24 DIAGNOSIS — E663 Overweight: Secondary | ICD-10-CM | POA: Diagnosis not present

## 2022-05-24 DIAGNOSIS — R635 Abnormal weight gain: Secondary | ICD-10-CM | POA: Diagnosis not present

## 2022-05-24 DIAGNOSIS — N914 Secondary oligomenorrhea: Secondary | ICD-10-CM | POA: Diagnosis not present

## 2022-05-24 DIAGNOSIS — E559 Vitamin D deficiency, unspecified: Secondary | ICD-10-CM | POA: Diagnosis not present

## 2022-05-24 DIAGNOSIS — F17210 Nicotine dependence, cigarettes, uncomplicated: Secondary | ICD-10-CM | POA: Diagnosis not present

## 2022-05-24 DIAGNOSIS — E611 Iron deficiency: Secondary | ICD-10-CM | POA: Diagnosis not present

## 2022-06-06 DIAGNOSIS — Z111 Encounter for screening for respiratory tuberculosis: Secondary | ICD-10-CM | POA: Diagnosis not present

## 2022-06-09 DIAGNOSIS — Z111 Encounter for screening for respiratory tuberculosis: Secondary | ICD-10-CM | POA: Diagnosis not present

## 2022-06-24 DIAGNOSIS — E663 Overweight: Secondary | ICD-10-CM | POA: Diagnosis not present

## 2022-06-24 DIAGNOSIS — Z79899 Other long term (current) drug therapy: Secondary | ICD-10-CM | POA: Diagnosis not present

## 2022-06-24 DIAGNOSIS — D539 Nutritional anemia, unspecified: Secondary | ICD-10-CM | POA: Diagnosis not present

## 2022-06-24 DIAGNOSIS — N914 Secondary oligomenorrhea: Secondary | ICD-10-CM | POA: Diagnosis not present

## 2022-06-24 DIAGNOSIS — E611 Iron deficiency: Secondary | ICD-10-CM | POA: Diagnosis not present

## 2022-06-24 DIAGNOSIS — Z131 Encounter for screening for diabetes mellitus: Secondary | ICD-10-CM | POA: Diagnosis not present

## 2022-06-24 DIAGNOSIS — F17210 Nicotine dependence, cigarettes, uncomplicated: Secondary | ICD-10-CM | POA: Diagnosis not present

## 2022-06-24 DIAGNOSIS — R7989 Other specified abnormal findings of blood chemistry: Secondary | ICD-10-CM | POA: Diagnosis not present

## 2022-06-24 DIAGNOSIS — E7800 Pure hypercholesterolemia, unspecified: Secondary | ICD-10-CM | POA: Diagnosis not present

## 2022-06-24 DIAGNOSIS — R635 Abnormal weight gain: Secondary | ICD-10-CM | POA: Diagnosis not present

## 2022-06-24 DIAGNOSIS — R5383 Other fatigue: Secondary | ICD-10-CM | POA: Diagnosis not present

## 2022-06-24 DIAGNOSIS — E559 Vitamin D deficiency, unspecified: Secondary | ICD-10-CM | POA: Diagnosis not present

## 2022-07-04 ENCOUNTER — Ambulatory Visit: Payer: BC Managed Care – PPO | Admitting: Podiatry

## 2022-07-04 ENCOUNTER — Ambulatory Visit (INDEPENDENT_AMBULATORY_CARE_PROVIDER_SITE_OTHER): Payer: BC Managed Care – PPO

## 2022-07-04 DIAGNOSIS — M10472 Other secondary gout, left ankle and foot: Secondary | ICD-10-CM | POA: Diagnosis not present

## 2022-07-04 DIAGNOSIS — M21612 Bunion of left foot: Secondary | ICD-10-CM

## 2022-07-04 DIAGNOSIS — R519 Headache, unspecified: Secondary | ICD-10-CM | POA: Insufficient documentation

## 2022-07-04 DIAGNOSIS — M2142 Flat foot [pes planus] (acquired), left foot: Secondary | ICD-10-CM | POA: Diagnosis not present

## 2022-07-04 DIAGNOSIS — M7741 Metatarsalgia, right foot: Secondary | ICD-10-CM

## 2022-07-04 DIAGNOSIS — R030 Elevated blood-pressure reading, without diagnosis of hypertension: Secondary | ICD-10-CM | POA: Insufficient documentation

## 2022-07-04 DIAGNOSIS — F321 Major depressive disorder, single episode, moderate: Secondary | ICD-10-CM | POA: Insufficient documentation

## 2022-07-04 DIAGNOSIS — M7742 Metatarsalgia, left foot: Secondary | ICD-10-CM | POA: Diagnosis not present

## 2022-07-04 DIAGNOSIS — N979 Female infertility, unspecified: Secondary | ICD-10-CM | POA: Insufficient documentation

## 2022-07-04 DIAGNOSIS — F17200 Nicotine dependence, unspecified, uncomplicated: Secondary | ICD-10-CM | POA: Insufficient documentation

## 2022-07-04 DIAGNOSIS — M2141 Flat foot [pes planus] (acquired), right foot: Secondary | ICD-10-CM

## 2022-07-04 DIAGNOSIS — N921 Excessive and frequent menstruation with irregular cycle: Secondary | ICD-10-CM | POA: Insufficient documentation

## 2022-07-04 MED ORDER — METHYLPREDNISOLONE 4 MG PO TBPK: ORAL_TABLET | ORAL | 0 refills | Status: DC

## 2022-07-04 NOTE — Patient Instructions (Signed)
Look for urea 40% cream or ointment and apply to the thickened dry skin / calluses. This can be bought over the counter, at a pharmacy or online such as [employer].  

## 2022-07-05 ENCOUNTER — Ambulatory Visit: Payer: BC Managed Care – PPO | Admitting: Podiatry

## 2022-07-08 NOTE — Progress Notes (Signed)
  Subjective:  Patient ID: Angela Hanna, female    DOB: 1976-11-15,  MRN: [id number]  Chief Complaint  Patient presents with   Callouses    Bilateral 5th submet  callouses.   Bunions    Developing on left foot, painful down the medial side of foot    46 y.o. female presents with the above complaint. History confirmed with patient.  She does have a family history of gout.  Has not been checked for it herself.  Objective:  Physical Exam: warm, good capillary refill, no trophic changes or ulcerative lesions, normal DP and PT pulses, normal sensory exam, she has some pain or tenderness in the first MTPJ on the left foot.  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  She has pes planus deformity Assessment:   1. Bunion of great toe of left foot   2. Other secondary acute gout of left foot      Plan:  Patient was evaluated and treated and all questions answered.  Discussed etiology treatment options of the pain in her left great toe joint.  Discussed this could be related to developing bunion although this is not severe on her radiographs.  She does have some para-articular erosions and a strong family history of gouty arthritis.  I prescribed her a methylprednisolone taper and ordered a uric acid level to evaluate for this.  We discussed etiology and treatment options of gout both acute and chronic   She also has pain under the first and fifth metatarsal heads bilateral with significant hyperkeratoses.  We discussed routine intermittent debridement of the calluses, treating with creams such as urea.  Finally discussed offloading with orthoses.  I do think she would greatly benefit from a custom molded foot orthosis.  She was fitted for these today to offload the metatarsal heads and metatarsalgia.  I will see her back in 6  weeks for follow-up.  No follow-ups on file.

## 2022-07-12 DIAGNOSIS — M10472 Other secondary gout, left ankle and foot: Secondary | ICD-10-CM | POA: Diagnosis not present

## 2022-07-13 LAB — URIC ACID: Uric Acid: 4.9 mg/dL (ref 2.6–6.2)

## 2022-07-22 ENCOUNTER — Telehealth: Payer: Self-pay | Admitting: Podiatry

## 2022-07-22 NOTE — Telephone Encounter (Signed)
No answer left voice mail

## 2022-07-24 DIAGNOSIS — R635 Abnormal weight gain: Secondary | ICD-10-CM | POA: Diagnosis not present

## 2022-07-24 DIAGNOSIS — E663 Overweight: Secondary | ICD-10-CM | POA: Diagnosis not present

## 2022-07-24 DIAGNOSIS — N914 Secondary oligomenorrhea: Secondary | ICD-10-CM | POA: Diagnosis not present

## 2022-07-24 DIAGNOSIS — F17210 Nicotine dependence, cigarettes, uncomplicated: Secondary | ICD-10-CM | POA: Diagnosis not present

## 2022-07-24 DIAGNOSIS — E559 Vitamin D deficiency, unspecified: Secondary | ICD-10-CM | POA: Diagnosis not present

## 2022-07-24 DIAGNOSIS — F3342 Major depressive disorder, recurrent, in full remission: Secondary | ICD-10-CM | POA: Diagnosis not present

## 2022-08-08 ENCOUNTER — Encounter (HOSPITAL_COMMUNITY): Payer: Self-pay

## 2022-08-08 ENCOUNTER — Ambulatory Visit (HOSPITAL_COMMUNITY)
Admission: RE | Admit: 2022-08-08 | Discharge: 2022-08-08 | Disposition: A | Discharge status: Home / Self Care | Payer: BC Managed Care – PPO | Source: Ambulatory Visit | Attending: Family | Admitting: Family

## 2022-08-08 ENCOUNTER — Telehealth (HOSPITAL_COMMUNITY): Payer: Self-pay | Admitting: Family

## 2022-08-08 VITALS — BP 133/78 | HR 78 | Temp 99.3°F | Resp 18

## 2022-08-08 DIAGNOSIS — R609 Edema, unspecified: Secondary | ICD-10-CM | POA: Insufficient documentation

## 2022-08-08 DIAGNOSIS — M79672 Pain in left foot: Secondary | ICD-10-CM | POA: Diagnosis not present

## 2022-08-08 LAB — COMPREHENSIVE METABOLIC PANEL
ALT: 24 U/L (ref 0–44)
AST: 28 U/L (ref 15–41)
Albumin: 3.8 g/dL (ref 3.5–5.0)
Alkaline Phosphatase: 36 U/L — ABNORMAL LOW (ref 38–126)
Anion gap: 4 — ABNORMAL LOW (ref 5–15)
BUN: 8 mg/dL (ref 6–20)
CO2: 25 mmol/L (ref 22–32)
Calcium: 8.9 mg/dL (ref 8.9–10.3)
Chloride: 108 mmol/L (ref 98–111)
Creatinine, Ser: 0.9 mg/dL (ref 0.44–1.00)
GFR, Estimated: >60 mL/min (ref >60)
Glucose, Bld: 94 mg/dL (ref 70–99)
Potassium: 4 mmol/L (ref 3.5–5.1)
Sodium: 137 mmol/L (ref 135–145)
Total Bilirubin: 0.4 mg/dL (ref 0.3–1.2)
Total Protein: 6 g/dL — ABNORMAL LOW (ref 6.5–8.1)

## 2022-08-08 MED ORDER — HYDROCHLOROTHIAZIDE 25 MG PO TABS: 25.0000 mg | ORAL_TABLET | Freq: Every day | ORAL | 0 refills | Status: DC

## 2022-08-08 NOTE — ED Provider Notes (Signed)
[place]    CSN: [id number] Arrival date & time: 08/08/22  1617      History   Chief Complaint Chief Complaint  Patient presents with   Ankle Pain    Ankle pain and swelling - Entered by patient    HPI Angela Hanna is a 46 y.o. female.   46 year old female presents with bilateral lower leg and ankle/foot swelling for the past 3 to 4 days. Has noticed left ankle more swollen and occasionally painful compared to right. No distinct injury or trauma. Has been standing a lot and has history of metatarsalgia (ball of foot painful and inflamed) and flat feet in which she saw the Podiatrist last month. She was prescribed oral steroids which did not help and was fitted for orthotics which she will pick up next week. She denies any headache, shortness of breath, chest pain, or palpitations. She has never had swelling in her lower legs/ankles to this extent. No rash or skin color change. She has taken Tylenol with minimal relief. She has elevated her legs which help and swelling has decreased slightly. Had recent x-ray of her left foot at the Podiatrist which showed bunion formation and foot conditions listed above. She was negative for gout. Other chronic health issues include anemia. Currently on Vit D, Iron and Phentermine daily.   The history is provided by the patient.    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 No pertinent surgical history.  OB History      Gravida  0   Para  0   Term  0   Preterm  0   AB  0   Living  0      SAB  0   IAB  0   Ectopic  0   Multiple  0   Live Births               Home Medications    Prior to Admission medications   Medication Sig Start Date End Date Taking? Authorizing Provider  hydrochlorothiazide (HYDRODIURIL) 25 MG tablet Take 1 tablet (25 mg total) by mouth daily. In the morning. 08/08/22   Emilianna Barlowe, Nicholes Stairs, NP  Multiple Vitamins-Minerals (MULTIVITAMIN PO) Take by mouth as needed.     [provider]  phentermine 30 MG capsule Take 30 mg by mouth daily. 06/24/22   [provider]  SV IRON 325 MG tablet Take 325 mg by mouth daily. 04/18/22   [provider]  Testosterone 1.62 % GEL SMARTSIG:1-2 pump Topical Daily 05/24/22   [provider]  Vitamin D, Ergocalciferol, (DRISDOL) 1.25 MG (50000 UNIT) CAPS capsule Take 50,000 Units by mouth once a week. 06/24/22   [provider]    Family History Family History  Problem Relation Age of Onset   Diabetes Mother    Hypertension Mother    Cancer Maternal Grandfather        prostate   Cancer Paternal Grandmother  lung    Social History Social History   Tobacco Use   Smoking status: Every Day    Years: 20.00    Types: Cigarettes   Smokeless tobacco: Never   Tobacco comments:    2 packs per week  Vaping Use   Vaping Use: Never used  Substance Use Topics   Alcohol use: Yes    Alcohol/week: 3.0 - 4.0 standard drinks of alcohol    Types: 3 - 4 Standard drinks or equivalent per week   Drug use: No     Allergies   Patient has no known allergies.   Review of Systems Review of Systems  Constitutional:  Negative for activity change, appetite change, chills, diaphoresis, fatigue and fever.  HENT:  Negative for sore throat and trouble swallowing.   Respiratory:  Negative for cough, chest tightness, shortness of breath and wheezing.   Cardiovascular:  Positive for leg swelling.  Negative for chest pain and palpitations.  Musculoskeletal:  Positive for arthralgias. Negative for gait problem and myalgias.  Skin:  Negative for color change, rash and wound.  Allergic/Immunologic: Negative for environmental allergies, food allergies and immunocompromised state.  Neurological:  Negative for dizziness, tremors, seizures, syncope, speech difficulty, weakness, light-headedness and numbness.  Hematological:  Negative for adenopathy. Does not bruise/bleed easily.     Physical Exam Triage Vital Signs ED Triage Vitals  Enc Vitals Group     BP 08/08/22 1639 133/78     Pulse Rate 08/08/22 1639 78     Resp 08/08/22 1639 18     Temp 08/08/22 1639 99.3 F (37.4 C)     Temp Source 08/08/22 1639 Oral     SpO2 08/08/22 1639 99 %     Weight --      Height --      Head Circumference --      Peak Flow --      Pain Score 08/08/22 1638 0     Pain Loc --      Pain Edu? --      Excl. in [place]? --    No data found.  Updated Vital Signs BP 133/78 (BP Location: Left Arm)   Pulse 78   Temp 99.3 F (37.4 C) (Oral)   Resp 18   SpO2 99%   Visual Acuity Right Eye Distance:   Left Eye Distance:   Bilateral Distance:    Right Eye Near:   Left Eye Near:    Bilateral Near:     Physical Exam Vitals and nursing note reviewed.  Constitutional:      General: She is awake. She is not in acute distress.    Appearance: She is well-developed and well-groomed.     Comments: She is sitting on the exam table in no acute distress and is able to ambulate without difficulty.   HENT:     Head: Normocephalic and atraumatic.     Right Ear: Hearing normal.     Left Ear: Hearing normal.  Cardiovascular:     Rate and Rhythm: Normal rate and regular rhythm.     Heart sounds: Normal heart sounds. No murmur heard. Pulmonary:     Effort: Pulmonary effort is normal. No respiratory distress.     Breath sounds: Normal breath sounds and air entry. No decreased air movement. No decreased breath  sounds, wheezing, rhonchi or rales.  Musculoskeletal:        General: Swelling present. Normal range of motion.     Right lower leg: Swelling present. No  tenderness. 1+ Edema present.     Left lower leg: Swelling present. No tenderness. 1+ Edema present.     Right ankle: Swelling present. No tenderness. Normal range of motion. Anterior drawer test negative. Normal pulse.     Right Achilles Tendon: Normal.     Left ankle: Swelling present. No tenderness. Normal range of motion. Anterior drawer test negative. Normal pulse.     Left Achilles Tendon: Normal.     Right foot: Normal range of motion and normal capillary refill. Swelling present. No tenderness. Normal pulse.     Left foot: Normal range of motion and normal capillary refill. Swelling present. No tenderness. Normal pulse.       Legs:     Comments: Swelling/1+non- pitting edema present in lower legs bilaterally with left leg and ankle slightly more swollen than right. No redness or skin discoloration present. No skin breakdown. Non-tender to palpation. Has full range of motion of ankles and feet without pain. Good distal pulses and capillary refill. No neuro deficits noted.   Skin:    General: Skin is warm and dry.     Capillary Refill: Capillary refill takes less than 2 seconds.     Findings: No bruising, ecchymosis, erythema, lesion, rash or wound.  Neurological:     General: No focal deficit present.     Mental Status: She is alert and oriented to person, place, and time.     Sensory: Sensation is intact. No sensory deficit.     Motor: Motor function is intact.     Gait: Gait is intact.  Psychiatric:        Mood and Affect: Mood normal.        Behavior: Behavior normal. Behavior is cooperative.        Thought Content: Thought content normal.        Judgment: Judgment normal.      UC Treatments / Results  Labs (all labs ordered are listed, but only abnormal results are displayed) Labs Reviewed  COMPREHENSIVE METABOLIC  PANEL - Abnormal; Notable for the following components:      Result Value   Total Protein 6.0 (*)    Alkaline Phosphatase 36 (*)    Anion gap 4 (*)    All other components within normal limits    EKG   Radiology No results found.  Procedures Procedures (including critical care time)  Medications Ordered in UC Medications - No data to display  Initial Impression / Assessment and Plan / UC Course  I have reviewed the triage vital signs and the nursing notes.  Pertinent labs & imaging results that were available during my care of the patient were reviewed by me and considered in my medical decision making (see chart for details).     Reviewed with patient that lower leg/ankle swelling may be due to venous insufficiency. Doubt cardiac origin at this time but will need to monitor. Doubt venous thrombus (blood clot). Obtained chemistry profile to check electrolyte levels and kidney function. May prescribe a diuretic if labs are normal. Recommend continue to elevate legs as much as possible. Continue OTC Tylenol '1000mg'$  every 8 hours as needed for pain. Limit and try to avoid extra sodium in her diet. Continue to push water and fluids. If any redness/skin discoloration, increased pain or increased swelling occurs in her legs/ankles/feet, especially if significantly more noticeable in one leg compared to the other, go to the ER ASAP. Otherwise, follow-up pending lab results and with her Podiatrist next week as  planned.  Final Clinical Impressions(s) / UC Diagnoses   Final diagnoses:  Peripheral edema  Left foot pain     Discharge Instructions       We are evaluating your blood for any distinct cause of the swelling in your lower legs/ankles. Recommend continue to elevate both legs. May continue OTC Tylenol '1000mg'$  every 8 hours as needed for pain. Avoid sodium in diet and continue to push water/fluids. We may prescribe a low dose diuretic pending your lab results. If any redness or  discoloration occurs, increased pain or increased swelling occurs in your legs, go to the ER ASAP. Otherwise, follow-up with your Podiatrist next week as planned and pending lab results today.     ED Prescriptions   None    PDMP not reviewed this encounter.   Katy Apo, NP 08/09/22 1022

## 2022-08-08 NOTE — Telephone Encounter (Signed)
Notified patient through her MyChart regarding essentially normal lab results and kidney function. May start HCTZ (Diuretic) to help with lower leg/ankle swelling. May take '25mg'$  once daily in AM for the next few days. Follow-up with her Podiatrist next week as planned.

## 2022-08-08 NOTE — Discharge Instructions (Signed)
We are evaluating your blood for any distinct cause of the swelling in your lower legs/ankles. Recommend continue to elevate both legs. May continue OTC Tylenol '1000mg'$  every 8 hours as needed for pain. Avoid sodium in diet and continue to push water/fluids. We may prescribe a low dose diuretic pending your lab results. If any redness or discoloration occurs, increased pain or increased swelling occurs in your legs, go to the ER ASAP. Otherwise, follow-up with your Podiatrist next week as planned and pending lab results today.

## 2022-08-08 NOTE — ED Triage Notes (Signed)
Pt reports left ankle and foot swelling since Monday. States it feels like foot is falling asleep. Denies any obvious injuries and pain.

## 2022-08-13 ENCOUNTER — Ambulatory Visit: Payer: BC Managed Care – PPO | Admitting: Podiatry

## 2022-08-13 DIAGNOSIS — M7741 Metatarsalgia, right foot: Secondary | ICD-10-CM

## 2022-08-13 DIAGNOSIS — M2141 Flat foot [pes planus] (acquired), right foot: Secondary | ICD-10-CM

## 2022-08-13 DIAGNOSIS — M21612 Bunion of left foot: Secondary | ICD-10-CM

## 2022-08-13 DIAGNOSIS — M10472 Other secondary gout, left ankle and foot: Secondary | ICD-10-CM | POA: Diagnosis not present

## 2022-08-13 DIAGNOSIS — M7742 Metatarsalgia, left foot: Secondary | ICD-10-CM

## 2022-08-13 DIAGNOSIS — M2142 Flat foot [pes planus] (acquired), left foot: Secondary | ICD-10-CM

## 2022-08-14 NOTE — Progress Notes (Signed)
  Subjective:  Patient ID: Angela Hanna, female    DOB: 02-01-1976,  MRN: [id number]  Chief Complaint  Patient presents with   Follow-up    6wk f/u to left great toe joint pain- Patient stated she is doing better but she did have left anke swelling last Monday and went to urgent care last Thursday. Was dx with peripheral edema and is now taking HCTZ once daily.  Picking up orthotics    46 y.o. female presents with the above complaint. History confirmed with patient.  Doing much better  Objective:  Physical Exam: warm, good capillary refill, no trophic changes or ulcerative lesions, normal DP and PT pulses, normal sensory exam,  today in she has pes planus deformity with hyperkeratosis submetatarsal 1 and 5  Radiographs: Multiple views x-ray of the left foot: There is bunion deformity on the left foot, slight periarticular erosion as well.  She has pes planus deformity Assessment:   1. Bunion of great toe of left foot   2. Metatarsalgia of both feet   3. Other secondary acute gout of left foot   4. Pes planus of both feet       Plan:  Patient was evaluated and treated and all questions answered.  Overall doing much better seems to have fully resolved at this point.  Return to see me as needed for this.  Her orthotics were ready and dispensed and assessed for form fit and function.  I will see her back as needed for orthotic adjustments and/or further issues  Return if symptoms worsen or fail to improve.

## 2022-09-05 DIAGNOSIS — N914 Secondary oligomenorrhea: Secondary | ICD-10-CM | POA: Diagnosis not present

## 2022-09-05 DIAGNOSIS — F17210 Nicotine dependence, cigarettes, uncomplicated: Secondary | ICD-10-CM | POA: Diagnosis not present

## 2022-09-05 DIAGNOSIS — R635 Abnormal weight gain: Secondary | ICD-10-CM | POA: Diagnosis not present

## 2022-09-05 DIAGNOSIS — E559 Vitamin D deficiency, unspecified: Secondary | ICD-10-CM | POA: Diagnosis not present

## 2022-09-05 DIAGNOSIS — E663 Overweight: Secondary | ICD-10-CM | POA: Diagnosis not present

## 2022-10-15 DIAGNOSIS — E663 Overweight: Secondary | ICD-10-CM | POA: Diagnosis not present

## 2022-10-15 DIAGNOSIS — F3342 Major depressive disorder, recurrent, in full remission: Secondary | ICD-10-CM | POA: Diagnosis not present

## 2022-10-15 DIAGNOSIS — F17210 Nicotine dependence, cigarettes, uncomplicated: Secondary | ICD-10-CM | POA: Diagnosis not present

## 2022-10-15 DIAGNOSIS — N914 Secondary oligomenorrhea: Secondary | ICD-10-CM | POA: Diagnosis not present

## 2022-10-15 DIAGNOSIS — E611 Iron deficiency: Secondary | ICD-10-CM | POA: Diagnosis not present

## 2022-10-15 DIAGNOSIS — R635 Abnormal weight gain: Secondary | ICD-10-CM | POA: Diagnosis not present

## 2022-11-15 DIAGNOSIS — N914 Secondary oligomenorrhea: Secondary | ICD-10-CM | POA: Diagnosis not present

## 2022-11-15 DIAGNOSIS — F17210 Nicotine dependence, cigarettes, uncomplicated: Secondary | ICD-10-CM | POA: Diagnosis not present

## 2022-11-15 DIAGNOSIS — E663 Overweight: Secondary | ICD-10-CM | POA: Diagnosis not present

## 2022-11-15 DIAGNOSIS — F3342 Major depressive disorder, recurrent, in full remission: Secondary | ICD-10-CM | POA: Diagnosis not present

## 2022-11-15 DIAGNOSIS — R635 Abnormal weight gain: Secondary | ICD-10-CM | POA: Diagnosis not present

## 2022-11-15 DIAGNOSIS — E559 Vitamin D deficiency, unspecified: Secondary | ICD-10-CM | POA: Diagnosis not present

## 2023-03-07 ENCOUNTER — Other Ambulatory Visit: Payer: Self-pay | Admitting: Cardiology

## 2023-03-07 NOTE — Congregational Nurse Program (Signed)
Education provided to client on risk of [place] Blood Pressure, she states her parents both have HTN . Educaiton provide and encouraged to call her PCP and keep a log of daily BP checks to share with PCP.

## 2023-03-10 LAB — LIPID PANEL W/O CHOL/HDL RATIO
Cholesterol, Total: 204 mg/dL — ABNORMAL HIGH (ref 100–199)
HDL: 87 mg/dL (ref >39)
LDL Chol Calc (NIH): 100 mg/dL — ABNORMAL HIGH (ref 0–99)
Triglycerides: 100 mg/dL (ref 0–149)
VLDL Cholesterol Cal: 17 mg/dL (ref 5–40)

## 2023-10-05 ENCOUNTER — Encounter (HOSPITAL_COMMUNITY): Payer: Self-pay

## 2023-10-05 ENCOUNTER — Ambulatory Visit (INDEPENDENT_AMBULATORY_CARE_PROVIDER_SITE_OTHER): Payer: No Typology Code available for payment source

## 2023-10-05 ENCOUNTER — Ambulatory Visit (HOSPITAL_COMMUNITY)
Admission: EM | Admit: 2023-10-05 | Discharge: 2023-10-05 | Disposition: A | Discharge status: Home / Self Care | Payer: No Typology Code available for payment source | Attending: Physician Assistant | Admitting: Physician Assistant

## 2023-10-05 DIAGNOSIS — S92325A Nondisplaced fracture of second metatarsal bone, left foot, initial encounter for closed fracture: Secondary | ICD-10-CM

## 2023-10-05 DIAGNOSIS — M79672 Pain in left foot: Secondary | ICD-10-CM

## 2023-10-05 MED ORDER — IBUPROFEN 800 MG PO TABS: 800.0000 mg | ORAL_TABLET | Freq: Three times a day (TID) | ORAL | 0 refills | Status: AC

## 2023-10-05 NOTE — ED Triage Notes (Signed)
Patient reports that she woke up a week ago and had pain to the top of the left foot and worsening in the past few days. Patient denies any injury. Pain is worse with standing and weight bearing.  Patient states she has been taking Tylenol and Aleve for pain. Patient states she has been applying OTC pain patches  No pain medication today.Angela Hanna

## 2023-10-05 NOTE — ED Provider Notes (Signed)
[facility]    CSN: [id number] Arrival date & time: 10/05/23  1149      History   Chief Complaint Chief Complaint  Patient presents with   Foot Pain    HPI WINSLOW PRINDLE is a 47 y.o. female.   Patient presents today with a week long history of left foot pain.  She denies any known injury or increase in activity prior to symptom onset.  Denies previous injury or surgery involving her foot.  She reports that currently pain is rated 7 on a 0-10 pain scale but increases to 8/9 with attempted ambulation, localized to her middle left foot with radiation towards the toes, described as intense aching with periodic sharp pain, no alleviating factors identified.  She has seen a podiatrist in the past but denies diagnosis of gout or rheumatoid arthritis.  Reports that she was worked up for these conditions and that was negative.  She denies any recent medication changes.  She does have flatfeet and wonders if this could be contributing to symptoms.  Denies change to her footwear.  She is confident that she is not pregnant.  She has tried topical and over-the-counter analgesics without improvement of symptoms.    Past Medical History:  Diagnosis Date   Abnormal Pap smear of cervix    yrs ago, pt believes she had a procedure done for this   Anemia     Patient Active Problem List   Diagnosis Date Noted   Elevated blood-pressure reading without diagnosis of hypertension 07/04/2022   Excessive and frequent menstruation with irregular cycle 07/04/2022   Female infertility 07/04/2022   Headache, unspecified 07/04/2022   Moderate major depression, single episode ([facility]) 07/04/2022   Nicotine dependence 07/04/2022   Fibroids 09/22/2014    History reviewed. No pertinent surgical history.  OB History     Gravida  0   Para  0   Term  0   Preterm  0   AB  0   Living  0      SAB  0   IAB  0   Ectopic  0   Multiple  0   Live Births               Home  Medications    Prior to Admission medications   Medication Sig Start Date End Date Taking? Authorizing Provider  ibuprofen (ADVIL) 800 MG tablet Take 1 tablet (800 mg total) by mouth 3 (three) times daily. 10/05/23  Yes Matricia Begnaud K, PA-C  Multiple Vitamins-Minerals (MULTIVITAMIN PO) Take by mouth as needed.     [provider]  phentermine 30 MG capsule Take 30 mg by mouth daily. 06/24/22   [provider]  SV IRON 325 MG tablet Take 325 mg by mouth daily. 04/18/22   [provider]  Vitamin D, Ergocalciferol, (DRISDOL) 1.25 MG (50000 UNIT) CAPS capsule Take 50,000 Units by mouth once a week. 06/24/22   [provider]    Family History Family History  Problem Relation Age of Onset   Diabetes Mother    Hypertension Mother    Cancer Maternal Grandfather        prostate   Cancer Paternal Grandmother        lung    Social History Social History   Tobacco Use   Smoking status: Every Day    Types: Cigarettes   Smokeless tobacco: Never   Tobacco comments:    2 packs per week  Vaping Use  Vaping status: Never Used  Substance Use Topics   Alcohol use: Yes    Alcohol/week: 3.0 - 4.0 standard drinks of alcohol    Types: 3 - 4 Standard drinks or equivalent per week   Drug use: No     Allergies   Patient has no known allergies.   Review of Systems Review of Systems  Constitutional:  Positive for activity change. Negative for appetite change, fatigue and fever.  Musculoskeletal:  Positive for arthralgias and gait problem. Negative for joint swelling and myalgias.  Neurological:  Negative for weakness and numbness.     Physical Exam Triage Vital Signs ED Triage Vitals  Encounter Vitals Group     BP 10/05/23 1220 (!) 156/98     Systolic BP Percentile --      Diastolic BP Percentile --      Pulse Rate 10/05/23 1220 73     Resp 10/05/23 1220 14     Temp 10/05/23 1220 98.3 F (36.8 C)     Temp Source 10/05/23 1220 Oral     SpO2  10/05/23 1220 97 %     Weight --      Height --      Head Circumference --      Peak Flow --      Pain Score 10/05/23 1222 8     Pain Loc --      Pain Education --      Exclude from Growth Chart --    No data found.  Updated Vital Signs BP (!) 156/98 (BP Location: Left Arm)   Pulse 73   Temp 98.3 F (36.8 C) (Oral)   Resp 14   SpO2 97%   Visual Acuity Right Eye Distance:   Left Eye Distance:   Bilateral Distance:    Right Eye Near:   Left Eye Near:    Bilateral Near:     Physical Exam Vitals reviewed.  Constitutional:      General: She is awake. She is not in acute distress.    Appearance: Normal appearance. She is well-developed. She is not ill-appearing.     Comments: Very pleasant female appears stated age in no acute distress sitting comfortably in exam room  HENT:     Head: Normocephalic and atraumatic.  Cardiovascular:     Rate and Rhythm: Normal rate and regular rhythm.     Pulses:          Posterior tibial pulses are 2+ on the right side and 2+ on the left side.     Heart sounds: Normal heart sounds, S1 normal and S2 normal. No murmur heard.    Comments: Capillary refill within 2 seconds left toes. Pulmonary:     Effort: Pulmonary effort is normal.     Breath sounds: Normal breath sounds. No wheezing, rhonchi or rales.     Comments: Clear to auscultation bilaterally Musculoskeletal:     Left foot: Normal range of motion and normal capillary refill. Swelling and tenderness present. No bony tenderness. Normal pulse.       Legs:     Comments: Left foot: Tender to palpation with erythema and swelling noted dorsal left foot over second and third metatarsals.  No deformity noted.  Normal active range of motion at ankle and toes.  Foot is neurovascularly intact.  No wounds noted.  Psychiatric:        Behavior: Behavior is cooperative.      UC Treatments / Results  Labs (all labs ordered are listed,  but only abnormal results are displayed) Labs Reviewed -  No data to display  EKG   Radiology No results found.  Procedures Procedures (including critical care time)  Medications Ordered in UC Medications - No data to display  Initial Impression / Assessment and Plan / UC Course  I have reviewed the triage vital signs and the nursing notes.  Pertinent labs & imaging results that were available during my care of the patient were reviewed by me and considered in my medical decision making (see chart for details).     Patient is well-appearing, afebrile, nontoxic, nontachycardic.  Given bony tenderness x-ray was obtained that did show incomplete nondisplaced fracture of second metatarsal based on my primary read; we were still waiting for radiologist overread at the time of discharge and we will contact her if this is different and changes our treatment plan.  Patient has had significant pain with supportive footwear so was placed in a cam boot for comfort and support.  Recommend that she use ibuprofen 800 mg up to 3 times a day for pain relief but not to take additional NSAIDs with this medicine.  She is established with podiatry and was strongly encouraged to follow-up with them as soon as possible.  She is to keep her leg elevated.  We discussed that if anything changes or worsen she should return for reevaluation.  Strict return precautions given.  Work excuse note provided.  Final Clinical Impressions(s) / UC Diagnoses   Final diagnoses:  Closed nondisplaced fracture of second metatarsal bone of left foot, initial encounter  Foot pain, left     Discharge Instructions      Use the cam boot for comfort and support.  Take ibuprofen 800 mg 3 times daily.  Do not take additional NSAIDs with this medication including aspirin, ibuprofen/Advil, naproxen/Aleve.  Follow-up with podiatry as soon as possible.  Keep your leg elevated.  If anything worsens or changes please return for reevaluation.     ED Prescriptions     Medication Sig  Dispense Auth. Provider   ibuprofen (ADVIL) 800 MG tablet Take 1 tablet (800 mg total) by mouth 3 (three) times daily. 21 tablet Jenavee Laguardia, Noberto Retort, PA-C      PDMP not reviewed this encounter.   Jeani Hawking, PA-C 10/05/23 1304

## 2023-10-05 NOTE — Discharge Instructions (Signed)
Use the cam boot for comfort and support.  Take ibuprofen 800 mg 3 times daily.  Do not take additional NSAIDs with this medication including aspirin, ibuprofen/Advil, naproxen/Aleve.  Follow-up with podiatry as soon as possible.  Keep your leg elevated.  If anything worsens or changes please return for reevaluation.

## 2023-10-07 ENCOUNTER — Encounter: Payer: Self-pay | Admitting: Podiatry

## 2023-10-07 ENCOUNTER — Ambulatory Visit (INDEPENDENT_AMBULATORY_CARE_PROVIDER_SITE_OTHER): Payer: No Typology Code available for payment source | Admitting: Podiatry

## 2023-10-07 VITALS — Ht 62.5 in | Wt 177.0 lb

## 2023-10-07 DIAGNOSIS — M84375A Stress fracture, left foot, initial encounter for fracture: Secondary | ICD-10-CM | POA: Diagnosis not present

## 2023-10-07 DIAGNOSIS — E559 Vitamin D deficiency, unspecified: Secondary | ICD-10-CM

## 2023-10-08 NOTE — Progress Notes (Signed)
  Subjective:  Patient ID: Tally Due, female    DOB: 14-Feb-1976,  MRN: [id number]  Chief Complaint  Patient presents with   Fracture    RM1: Patient is here for left foot stress fracture of second toe, super tender with swelling and throbbing for last week hurts to walk feels pressured and weighted     Discussed the use of AI scribe software for clinical note transcription with the patient, who gave verbal consent to proceed.  History of Present Illness   The patient, who works for [employer] and is frequently on her feet, presents with left foot pain and swelling. She woke up one morning with the symptoms, which worsened over the week, leading to an urgent care visit where a stress fracture was diagnosed. The patient typically wears flats for work and has custom-made orthotics. She also reports a history of vitamin D deficiency and is currently on vitamin D supplements. The patient is also on Lupron.          Objective:    Physical Exam   EXTREMITIES: Left foot warm, well-perfused with palpable pulses and good capillary refill time. Pain on palpation of the distal second metatarsal with an area of swelling dorsal to this.       No images are attached to the encounter.    Results   RADIOLOGY Foot X-ray: Stress fracture of the second metatarsal (10/05/2023)      Assessment:   1. Vitamin D deficiency   2. Stress fracture of metatarsal bone of left foot, initial encounter      Plan:  Patient was evaluated and treated and all questions answered.  Assessment and Plan    Stress Fracture of the Second Metatarsal   X-ray confirmed a stress fracture on the dorsal aspect of her left foot, with her foot shape predisposing her to this condition. We will continue with the immobilization boot and use ice and anti-inflammatory medication (Aleve or Motrin) for pain management. A follow-up in one month will assess healing progress.  Vitamin D Deficiency   She is currently on  Vitamin D supplementation, though levels have not been recently checked. Given Vitamin D's role in bone density and healing, we will order lab work for calcium and Vitamin D levels and consider adjusting the supplementation based on the results.  Foot Shape and Shoe Fit   Her wide forefoot and narrow heel may contribute to stress on the second metatarsal, and current orthotics may require adjustment. She should bring her current orthotics to the next appointment for assessment, consider Vionic shoes for better support and fit, and revisit in one month to assess foot shape and shoe fit.          Return in about 1 month (around 11/07/2023) for follow up stress fx (new xrays).

## 2023-10-29 DIAGNOSIS — F17200 Nicotine dependence, unspecified, uncomplicated: Secondary | ICD-10-CM | POA: Insufficient documentation

## 2023-11-11 ENCOUNTER — Ambulatory Visit: Payer: No Typology Code available for payment source | Admitting: Podiatry

## 2023-11-11 ENCOUNTER — Encounter: Payer: Self-pay | Admitting: Podiatry

## 2023-11-11 ENCOUNTER — Ambulatory Visit (INDEPENDENT_AMBULATORY_CARE_PROVIDER_SITE_OTHER): Payer: No Typology Code available for payment source

## 2023-11-11 DIAGNOSIS — M84375D Stress fracture, left foot, subsequent encounter for fracture with routine healing: Secondary | ICD-10-CM | POA: Diagnosis not present

## 2023-11-11 NOTE — Progress Notes (Signed)
  Subjective:  Patient ID: Angela Hanna, female    DOB: Dec 13, 1976,  MRN: [id number]  Chief Complaint  Patient presents with   Fracture    Follow up stress fracture left   "Its a lot better than it was"     Discussed the use of AI scribe software for clinical note transcription with the patient, who gave verbal consent to proceed.  History of Present Illness   She is doing better pain is improved greatly   Objective:    Physical Exam   EXTREMITIES: Left foot warm, well-perfused with palpable pulses and good capillary refill time.  No pain to palpation   No images are attached to the encounter.    Results   RADIOLOGY Foot X-ray: New film taken today 11/11/2023 show good bony callus formation and consolidation of stress fracture     Assessment:   1. Vitamin D deficiency   2. Stress fracture of metatarsal bone of left foot, initial encounter      Plan:  Patient was evaluated and treated and all questions answered.  Assessment and Plan    Stress Fracture of the Second Metatarsal   Doing well the stress fracture is nearly fully healed at this point.  I recommended wearing the boot for an additional week for a full 6 weeks of treatment and then gradual transition back to athletic shoes with her orthotics which I inspected today the top-cover needs to be replaced in a less slippery material used as well as addition of a metatarsal pad and additional PPT under the heel.  She will be scheduled to see our orthotist for an adjustment visit in January would like her to have the orthotics until this visit to support the fracture healing.  Follow-up with me as needed.     Return in about 1 month (around 11/07/2023) for follow up stress fx (new xrays).

## 2023-11-16 IMAGING — MG DIGITAL DIAGNOSTIC BILAT W/ TOMO W/ CAD
6 of 12 series · 6 of 36 positions shown · non-contrast
Comparison: None.

CLINICAL DATA: Patient presents for palpable mass within the left
breast.

EXAM:
DIGITAL DIAGNOSTIC BILATERAL MAMMOGRAM WITH TOMOSYNTHESIS AND CAD;
ULTRASOUND LEFT BREAST LIMITED
TECHNIQUE: Bilateral digital diagnostic mammography and breast tomosynthesis
was performed. The images were evaluated with computer-aided
detection.; Targeted ultrasound examination of the left breast was
performed.

[L ML synth-2D]
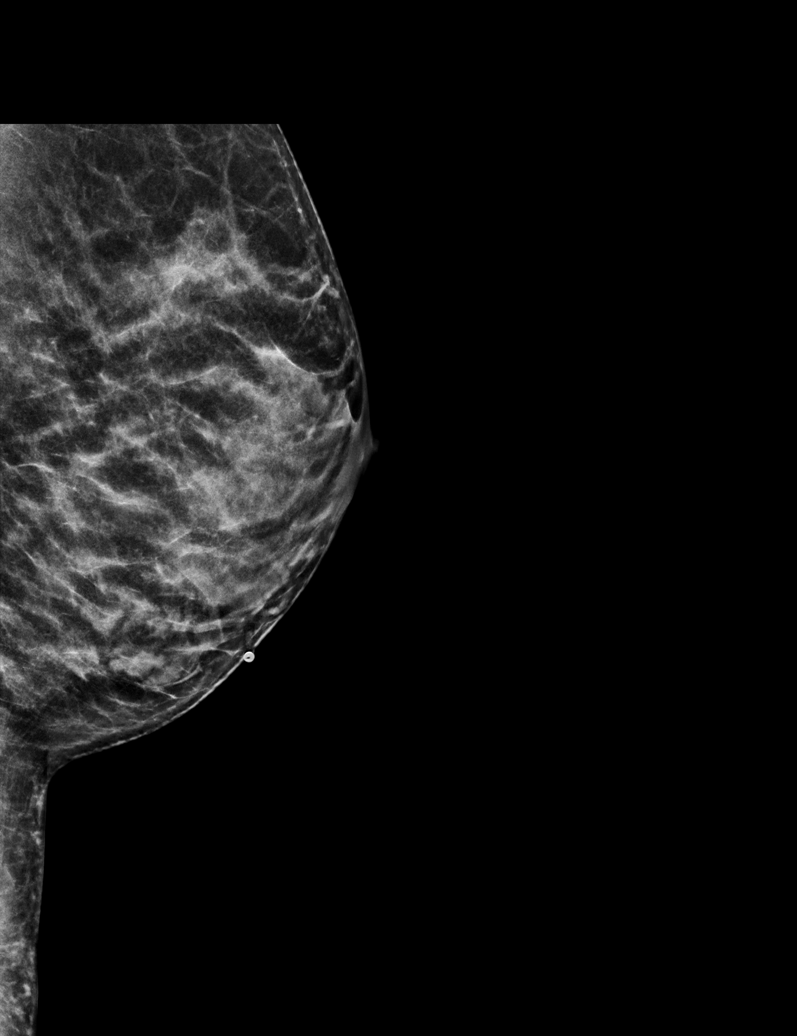

[L MLO synth-2D]
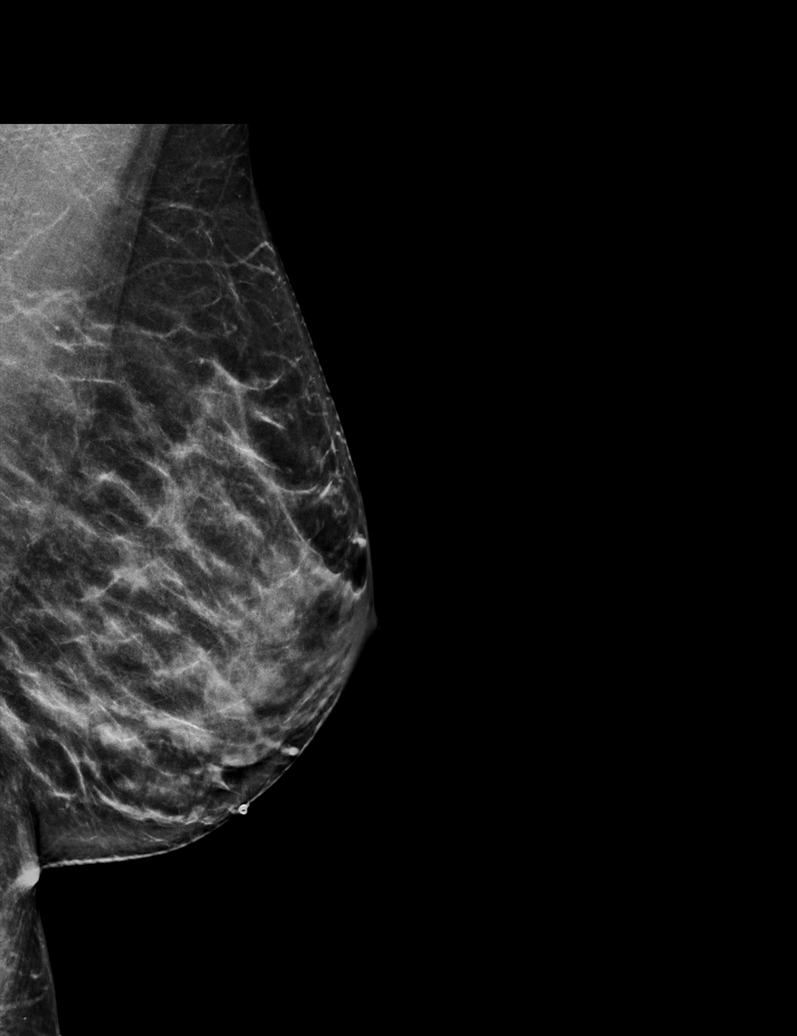

[L CC synth-2D]
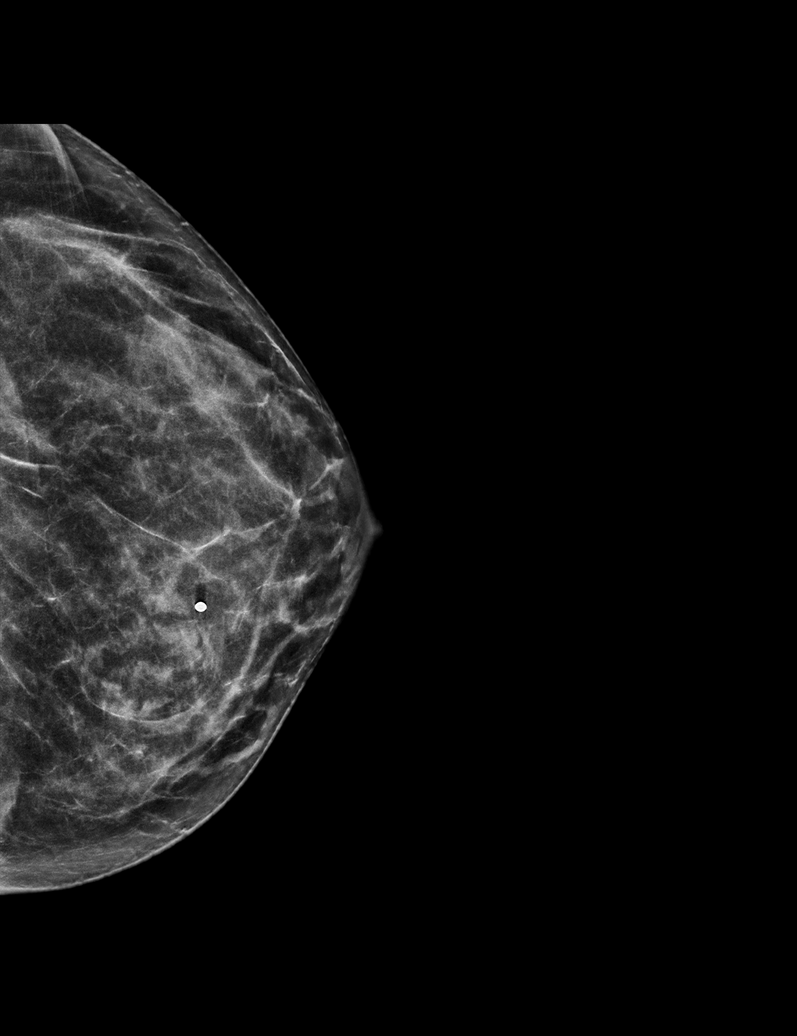

[R MLO synth-2D]
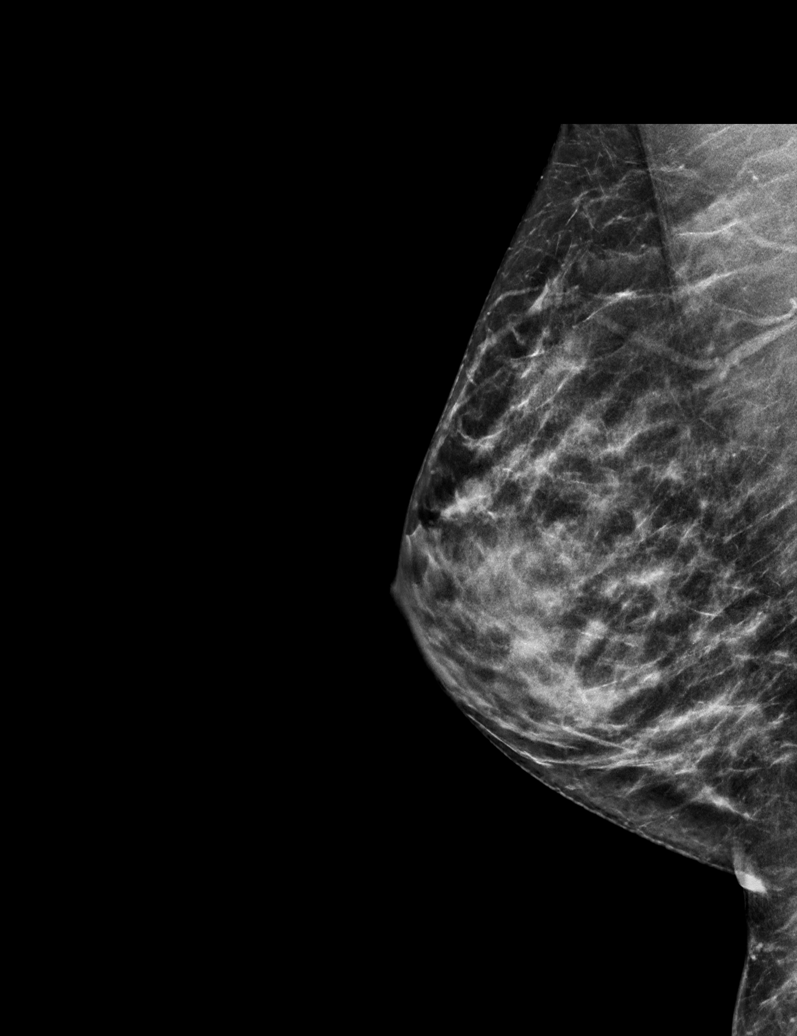

[L TAN synth-2D]
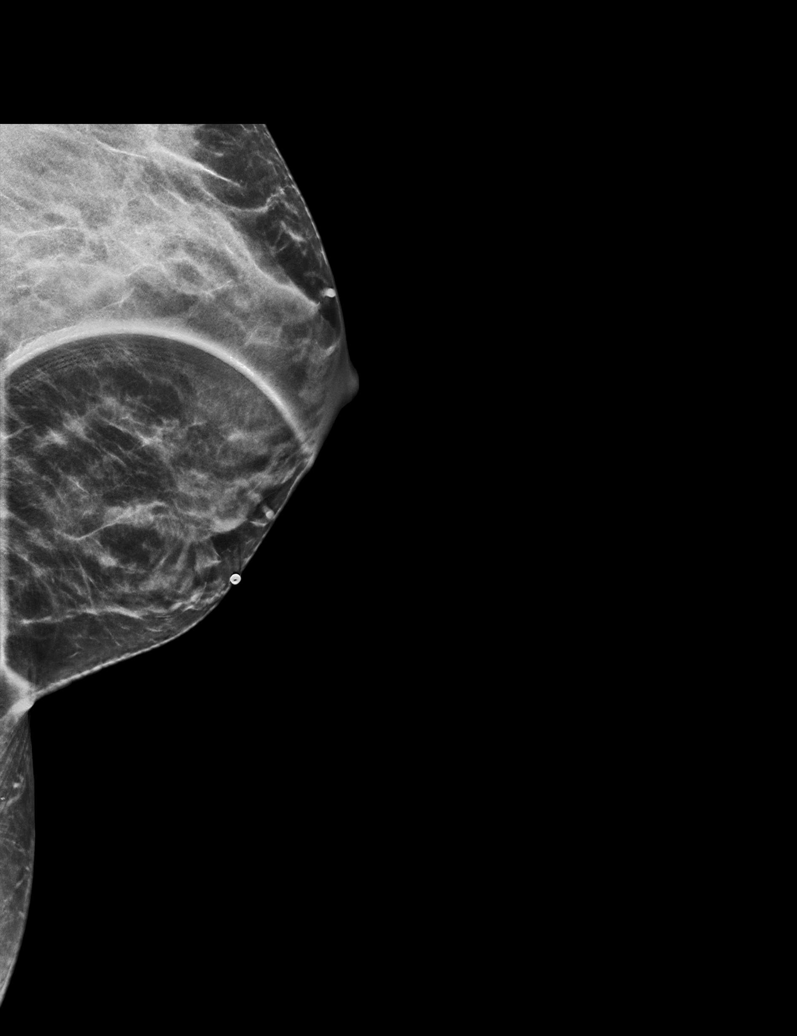

[R CC synth-2D]
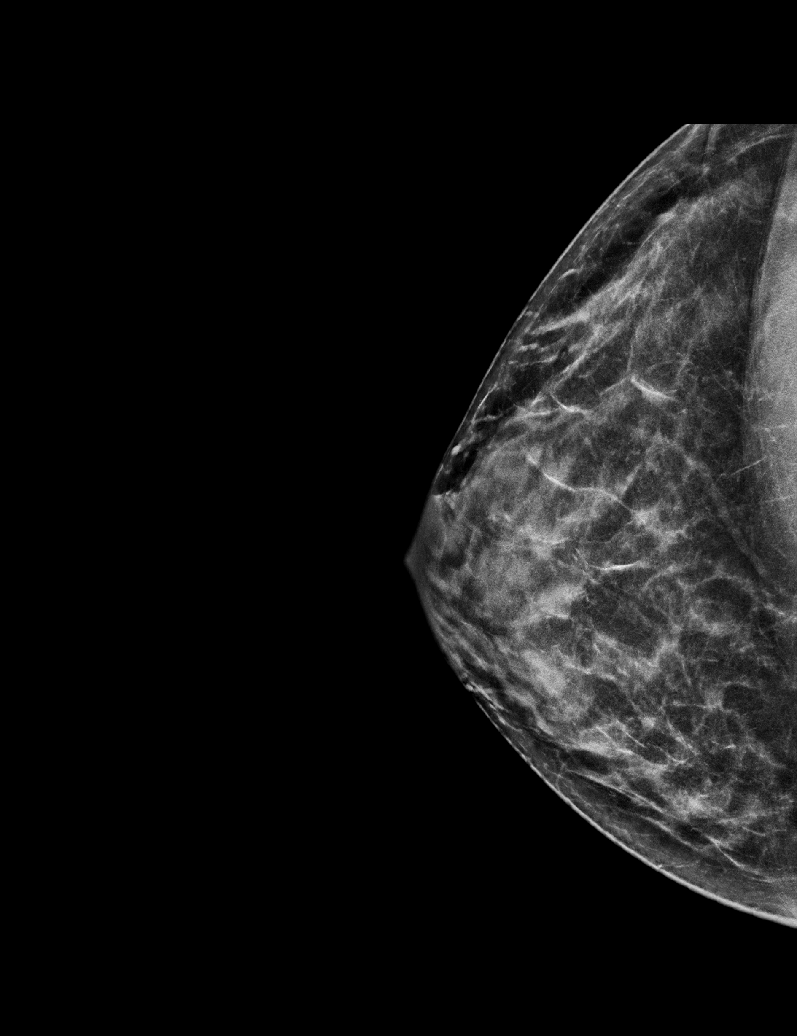

[6 of 36 positions shown; findings below may reference images not displayed]

ACR Breast Density Category c: The breast tissue is heterogeneously
dense, which may obscure small masses.
FINDINGS: Within the anterior inferior left breast underlying the palpable
marker is an oval circumscribed mass with central fatty density,
most suggestive of a hamartoma. No additional concerning findings
within either breast.

On physical exam, there is a soft mobile mass within the inferior
left breast.

Targeted ultrasound is performed, showing a 3.3 x 1.5 x 2.7 cm mixed
echogenicity mass left breast 6 o'clock position 3 cm from nipple at
the site of palpable concern, compatible with hamartoma.
IMPRESSION: Palpable mass left breast compatible with a hamartoma.

No mammographic evidence for malignancy.

RECOMMENDATION:
Continued clinical evaluation for left breast palpable area of
concern.

Screening mammogram in one year.(Code:TZ-8-6BN)

I have discussed the findings and recommendations with the patient.
If applicable, a reminder letter will be sent to the patient
regarding the next appointment.

BI-RADS CATEGORY  2: Benign.

## 2023-12-15 ENCOUNTER — Ambulatory Visit: Payer: No Typology Code available for payment source

## 2023-12-15 NOTE — Progress Notes (Signed)
Patient was here still having trouble with MT heads pain callous and feels like overpronation getting worse I rescanned patient having new pr made with Dr. Milas Gain specs in addition. Angela Hanna Cped, CFo, CFm  Old orthotics were left here on my desk

## 2024-01-21 ENCOUNTER — Ambulatory Visit: Payer: Managed Care, Other (non HMO)

## 2024-01-21 DIAGNOSIS — M2142 Flat foot [pes planus] (acquired), left foot: Secondary | ICD-10-CM

## 2024-01-21 DIAGNOSIS — M7741 Metatarsalgia, right foot: Secondary | ICD-10-CM

## 2024-01-21 NOTE — Progress Notes (Signed)
 Patient presents today to pick up custom molded foot orthotics, diagnosed with Bunion Pes planus Metatarsalgia and Stress FX left MT by Dr. Silva.   Orthotics were dispensed and fit was satisfactory. Reviewed instructions for break-in and wear. Written instructions given to patient.  Patient will follow up as needed.  [name]

## 2024-02-24 ENCOUNTER — Other Ambulatory Visit (HOSPITAL_COMMUNITY)
Admission: RE | Admit: 2024-02-24 | Discharge: 2024-02-24 | Disposition: A | Discharge status: Home / Self Care | Source: Ambulatory Visit | Attending: Obstetrics and Gynecology | Admitting: Obstetrics and Gynecology

## 2024-02-24 ENCOUNTER — Other Ambulatory Visit: Payer: Self-pay | Admitting: Obstetrics and Gynecology

## 2024-02-24 DIAGNOSIS — Z01419 Encounter for gynecological examination (general) (routine) without abnormal findings: Secondary | ICD-10-CM | POA: Diagnosis present

## 2024-03-02 LAB — CYTOLOGY - PAP
Adequacy: ABSENT
Comment: NEGATIVE
Comment: NEGATIVE
Comment: NEGATIVE
Diagnosis: NEGATIVE
HPV 16: NEGATIVE
HPV 18 / 45: POSITIVE — AB
High risk HPV: POSITIVE — AB

## 2024-03-03 ENCOUNTER — Other Ambulatory Visit: Payer: Self-pay

## 2024-03-03 ENCOUNTER — Encounter: Payer: Self-pay | Admitting: *Deleted

## 2024-03-03 ENCOUNTER — Ambulatory Visit: Admission: EM | Admit: 2024-03-03 | Discharge: 2024-03-03 | Disposition: A | Discharge status: Home / Self Care

## 2024-03-03 DIAGNOSIS — J069 Acute upper respiratory infection, unspecified: Secondary | ICD-10-CM

## 2024-03-03 LAB — POC COVID19/FLU A&B COMBO
Covid Antigen, POC: NEGATIVE
Influenza A Antigen, POC: NEGATIVE
Influenza B Antigen, POC: NEGATIVE

## 2024-03-03 MED ORDER — PSEUDOEPH-BROMPHEN-DM 30-2-10 MG/5ML PO SYRP: 10.0000 mL | ORAL_SOLUTION | Freq: Four times a day (QID) | ORAL | 0 refills | Status: AC | PRN

## 2024-03-03 MED ORDER — PREDNISONE 20 MG PO TABS
40.0000 mg | ORAL_TABLET | Freq: Every day | ORAL | 0 refills | Status: AC
Start: 2024-03-03 — End: 2024-03-08

## 2024-03-03 NOTE — Discharge Instructions (Addendum)
 Your FLU and COVID tests were negative!   Your symptoms are likely due to a viral respiratory infection. A respiratory infection is an illness that affects part of the respiratory system, such as the lungs, nose, or throat. Antibiotic medicines are not prescribed for viral infections. This is because antibiotics are designed to kill bacteria. They do not kill viruses. Take medications as prescribed. You may take tylenol or ibuprofen as needed for fevers/headache/body aches. Drink plenty of fluids. Go to the ED immediately if you get worse or have any other symptoms.

## 2024-03-03 NOTE — ED Provider Notes (Signed)
 [name]    CSN: [id number] Arrival date & time: 03/03/24  1525      History   Chief Complaint Chief Complaint  Patient presents with   Cough   Nasal Congestion   Fatigue    HPI Angela Hanna is a 48 y.o. female.   Subjective:   Angela Hanna is a 48 year old female presenting for evaluation of upper respiratory infection symptoms, including sore throat, nasal congestion, congested cough, fatigue, occasional chills, body aches, runny nose, congestion, and sneezing. Symptoms have been ongoing for the past four days. She denies fever, nausea, vomiting, diarrhea, or headache. She reports known sick contacts at work and has been using over-the-counter cough medicine and nighttime cold/sinus medication. She is a cigarette smoker but denies wheezing or shortness of breath. She is staying hydrated and drinking plenty of fluids.  The following portions of the patient's history were reviewed and updated as appropriate: allergies, current medications, past family history, past medical history, past social history, past surgical history, and problem list.         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 Authorizing Provider  brompheniramine-pseudoephedrine-DM 30-2-10 MG/5ML syrup Take 10 mLs by mouth every 6 (six) hours as needed. 03/03/24  Yes Lurline Idol, FNP  Ferrous Sulfate (IRON PO) Iron   Yes [provider]  Multiple Vitamins-Minerals (MULTIVITAMIN PO) Take by mouth as needed.    Yes [provider]  norethindrone (AYGESTIN) 5 MG tablet Take 5 mg by mouth daily. 03/02/24 05/31/24 Yes [provider]  Vitamin D, Ergocalciferol, (DRISDOL) 1.25 MG (50000 UNIT) CAPS capsule Take 50,000 Units by mouth once a week. 06/24/22  Yes [provider]  Dietary Management Product (ENLYTE) CAPS Take 1 capsule by mouth daily at 2 PM.    [provider]  ibuprofen (ADVIL) 800 MG tablet Take 1 tablet (800 mg total) by mouth 3 (three) times daily. Patient taking differently: Take 800 mg by mouth 3 (three) times daily. TAKES PRN 10/05/23   Raspet, Noberto Retort, PA-C  norethindrone (AYGESTIN) 5 MG tablet Take 5 mg by mouth daily. 11/27/23   [provider]  oxyCODONE (OXY IR/ROXICODONE) 5 MG immediate release tablet Take by mouth. Patient not taking: Reported on 03/03/2024 10/28/23   [provider]  phentermine (ADIPEX-P) 37.5 MG tablet Take  37.5 mg by mouth daily before breakfast. Patient taking differently: Take 37.5 mg by mouth daily before breakfast. TAKES PRN    [provider]  phentermine 30 MG capsule Take 30 mg by mouth daily. Patient not taking: Reported on 03/03/2024 06/24/22   [provider]  SV IRON 325 MG tablet Take 325 mg by mouth daily. 04/18/22   [provider]  UNABLE TO FIND Testosterone    [provider]    Family History Family History  Problem Relation Age of Onset   Diabetes Mother    Hypertension Mother    Cancer Maternal Grandfather        prostate   Cancer Paternal  Grandmother        lung    Social History Social History   Tobacco Use   Smoking status: Every Day    Types: Cigarettes   Smokeless tobacco: Never   Tobacco comments:    2 packs per week  Vaping Use   Vaping status: Never Used  Substance Use Topics   Alcohol use: Yes    Alcohol/week: 3.0 - 4.0 standard drinks of alcohol    Types: 3 - 4 Standard drinks or equivalent per week   Drug use: No     Allergies   Patient has no known allergies.   Review of Systems Review of Systems  Constitutional:  Positive for chills and fatigue. Negative for fever.  HENT:  Positive for congestion, rhinorrhea, sneezing and sore throat.   Respiratory:  Positive for cough. Negative for shortness of breath and wheezing.   Gastrointestinal:  Negative for diarrhea, nausea and vomiting.  Musculoskeletal:  Positive for myalgias.  Neurological:  Negative for headaches.  All other systems reviewed and are negative.    Physical Exam Triage Vital Signs ED Triage Vitals [03/03/24 1814]  Encounter Vitals Group     BP (!) 148/83     Systolic BP Percentile      Diastolic BP Percentile      Pulse Rate 76     Resp 18     Temp 99.4 F (37.4 C)     Temp Source Oral     SpO2 98 %     Weight      Height      Head Circumference      Peak Flow      Pain Score 5     Pain Loc      Pain Education      Exclude from Growth Chart    No data found.  Updated Vital Signs BP (!) 148/83 (BP Location: Right Arm)   Pulse 76   Temp 99.4 F (37.4 C) (Oral)   Resp 18   SpO2 98%   Visual Acuity Right Eye Distance:   Left Eye Distance:   Bilateral Distance:    Right Eye Near:   Left Eye Near:    Bilateral Near:     Physical Exam Vitals and nursing note reviewed.  Constitutional:      Appearance: Normal appearance.  HENT:     Head: Normocephalic.     Nose: Congestion present.     Mouth/Throat:     Mouth: Mucous membranes are moist.  Eyes:     Conjunctiva/sclera: Conjunctivae normal.      Pupils: Pupils are equal, round, and reactive to light.  Cardiovascular:     Rate and Rhythm: Normal rate and regular rhythm.  Pulmonary:     Effort: Pulmonary effort is normal.  Breath sounds: Normal breath sounds.  Musculoskeletal:        General: Normal range of motion.     Cervical back: Normal range of motion and neck supple.  Lymphadenopathy:     Cervical: No cervical adenopathy.  Skin:    General: Skin is warm and dry.  Neurological:     General: No focal deficit present.     Mental Status: She is alert and oriented to person, place, and time.      UC Treatments / Results  Labs (all labs ordered are listed, but only abnormal results are displayed) Labs Reviewed  POC COVID19/FLU A&B COMBO - Normal    EKG   Radiology No results found.  Procedures Procedures (including critical care time)  Medications Ordered in UC Medications - No data to display  Initial Impression / Assessment and Plan / UC Course  I have reviewed the triage vital signs and the nursing notes.  Pertinent labs & imaging results that were available during my care of the patient were reviewed by me and considered in my medical decision making (see chart for details).    Angela Hanna is a 48 year old female presenting with upper respiratory infection symptoms, including sore throat, nasal congestion, congested cough, fatigue, occasional chills, body aches, runny nose, congestion, and sneezing. She denies fever, nausea, vomiting, diarrhea, wheezing, shortness of breath, or headache. She is afebrile and nontoxic on examination, with nasal congestion noted but otherwise unremarkable findings. Flu and COVID testing are negative. Symptoms are most consistent with a viral respiratory illness. Prednisone and Bromfed DM were prescribed, and supportive care measures were discussed.    Today's evaluation has revealed no signs of a dangerous process. Diagnosis discussed with the patient and/or guardian,  including possible red flag symptoms and the need for close follow-up. Patient and/or guardian acknowledges understanding of verbal and written discharge instructions and is comfortable with the plan and disposition. Mental status is clear, with good insight into the illness after discussion and teaching, and sound judgment regarding care decisions.    Documentation was completed with the aid of voice recognition software and may contain typographical errors. Final Clinical Impressions(s) / UC Diagnoses   Final diagnoses:  Viral upper respiratory tract infection     Discharge Instructions      Your FLU and COVID tests were negative!   Your symptoms are likely due to a viral respiratory infection. A respiratory infection is an illness that affects part of the respiratory system, such as the lungs, nose, or throat. Antibiotic medicines are not prescribed for viral infections. This is because antibiotics are designed to kill bacteria. They do not kill viruses. Take medications as prescribed. You may take tylenol or ibuprofen as needed for fevers/headache/body aches. Drink plenty of fluids. Go to the ED immediately if you get worse or have any other symptoms.           ED Prescriptions     Medication Sig Dispense Auth. Provider   predniSONE (DELTASONE) 20 MG tablet Take 2 tablets (40 mg total) by mouth daily for 5 days. 10 tablet Lurline Idol, FNP   brompheniramine-pseudoephedrine-DM 30-2-10 MG/5ML syrup Take 10 mLs by mouth every 6 (six) hours as needed. 120 mL Lurline Idol, FNP      PDMP not reviewed this encounter.   Lurline Idol, [place] 03/12/24 1002

## 2024-03-03 NOTE — ED Triage Notes (Signed)
 Cough, congestion, fatigue sore throat since Friday. States she works around [employer]. Denies fever
